# Patient Record
Sex: Female | Born: 1985 | Race: White | Hispanic: No | Marital: Single | Smoking: Current every day smoker
Health system: Southern US, Community
[De-identification: ages and names within clinical notes are randomized; demographics above are authoritative.]

## PROBLEM LIST (undated history)

## (undated) ENCOUNTER — Inpatient Hospital Stay (HOSPITAL_COMMUNITY): Payer: Self-pay

## (undated) DIAGNOSIS — Z87442 Personal history of urinary calculi: Secondary | ICD-10-CM

## (undated) DIAGNOSIS — O021 Missed abortion: Secondary | ICD-10-CM

## (undated) DIAGNOSIS — J45909 Unspecified asthma, uncomplicated: Secondary | ICD-10-CM

## (undated) DIAGNOSIS — R519 Headache, unspecified: Secondary | ICD-10-CM

## (undated) DIAGNOSIS — R12 Heartburn: Secondary | ICD-10-CM

## (undated) DIAGNOSIS — O26899 Other specified pregnancy related conditions, unspecified trimester: Secondary | ICD-10-CM

## (undated) DIAGNOSIS — R51 Headache: Secondary | ICD-10-CM

## (undated) HISTORY — PX: LEEP: SHX91

## (undated) HISTORY — PX: WISDOM TOOTH EXTRACTION: SHX21

## (undated) HISTORY — DX: Unspecified asthma, uncomplicated: J45.909

---

## 2007-09-11 ENCOUNTER — Other Ambulatory Visit: Admission: RE | Admit: 2007-09-11 | Discharge: 2007-09-11 | Payer: Self-pay | Admitting: Obstetrics and Gynecology

## 2008-01-03 ENCOUNTER — Ambulatory Visit (HOSPITAL_COMMUNITY): Admission: RE | Admit: 2008-01-03 | Discharge: 2008-01-03 | Payer: Self-pay | Admitting: Obstetrics & Gynecology

## 2008-03-24 ENCOUNTER — Inpatient Hospital Stay (HOSPITAL_COMMUNITY): Admission: AD | Admit: 2008-03-24 | Discharge: 2008-03-25 | Payer: Self-pay | Admitting: Obstetrics & Gynecology

## 2008-03-25 ENCOUNTER — Ambulatory Visit: Payer: Self-pay | Admitting: Obstetrics and Gynecology

## 2008-03-25 ENCOUNTER — Inpatient Hospital Stay (HOSPITAL_COMMUNITY): Admission: AD | Admit: 2008-03-25 | Discharge: 2008-03-29 | Payer: Self-pay | Admitting: Obstetrics & Gynecology

## 2010-03-28 IMAGING — US US SOFT TISSUE HEAD/NECK
1 series · 14 of 25 positions shown · non-contrast
Comparison: None

CLINICAL DATA: Enlarged thyroid gland, tachycardia, weight gain

THYROID ULTRASOUND
TECHNIQUE: Ultrasound examination of the thyroid gland and
adjacent soft tissues was performed.

[Series 1: us soft tissue head/neck · 0.07mm/px · 14 of 26 slices shown]
[im 1/26]
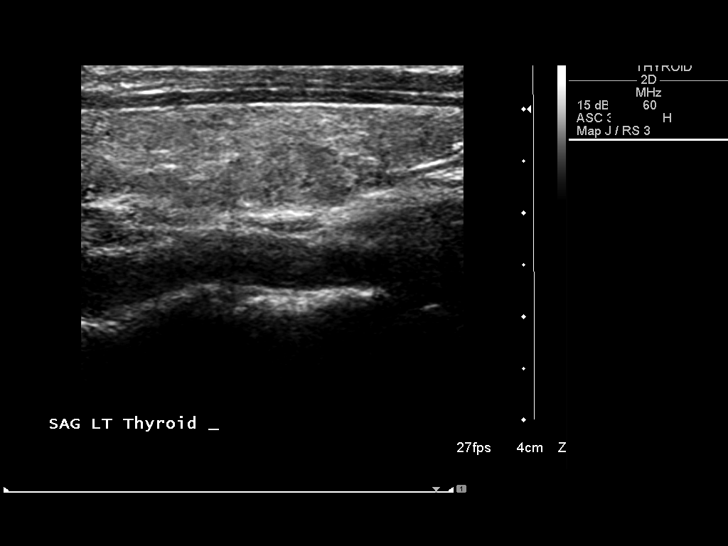
[im 3/26]
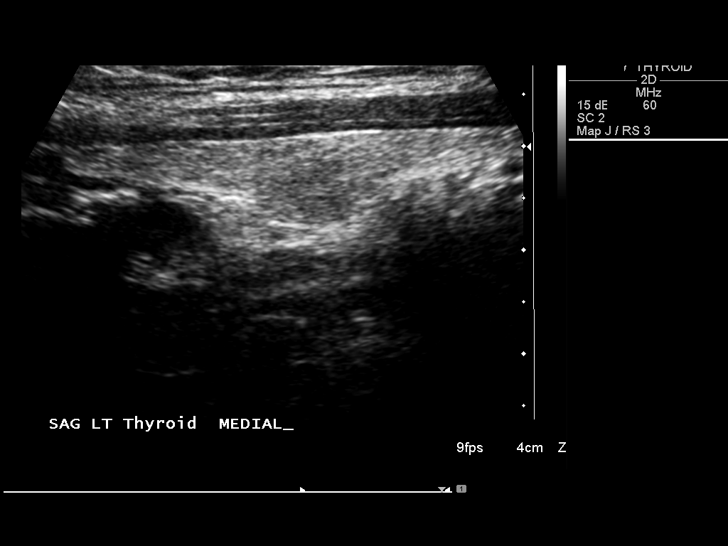
[im 5/26]
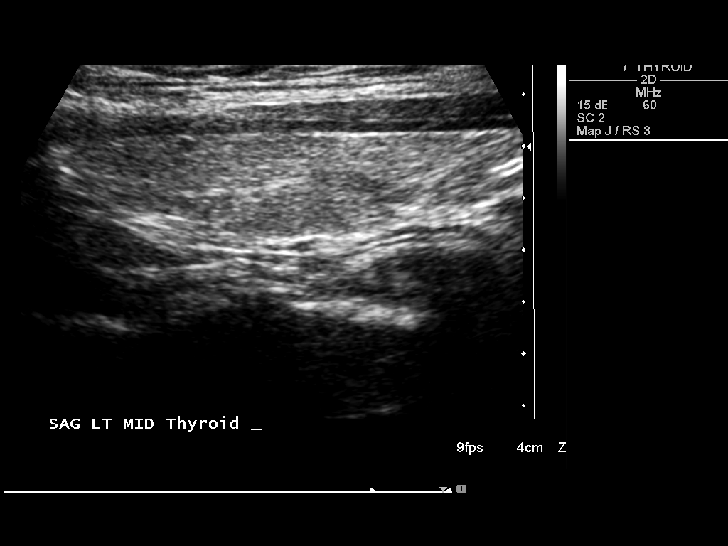
[im 7/26]
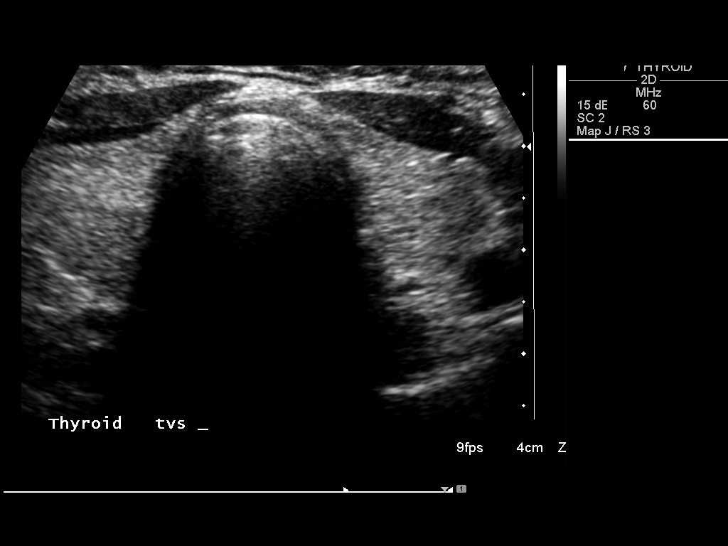
[im 9/26]
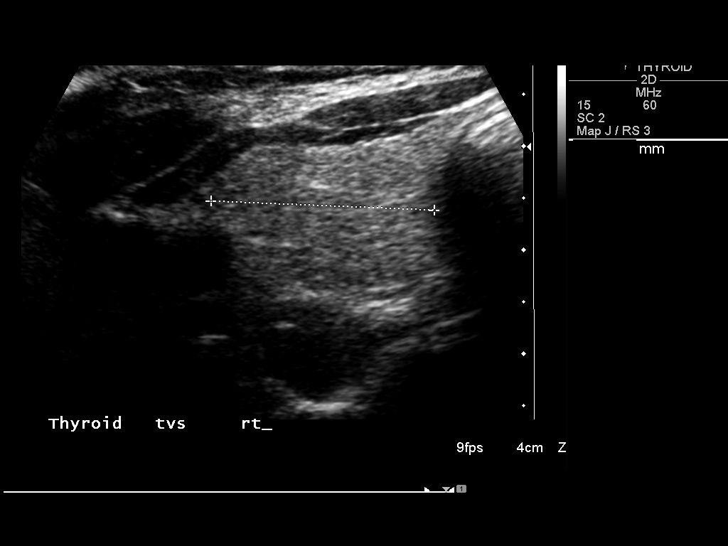
[im 10/26]
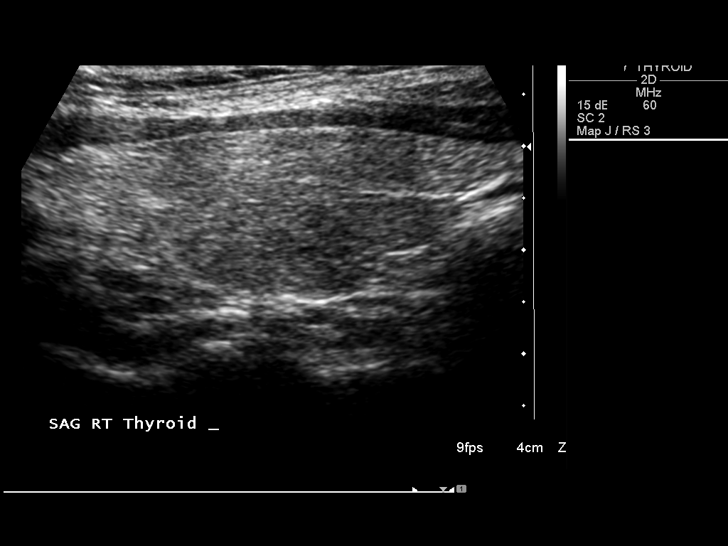
[im 12/26]
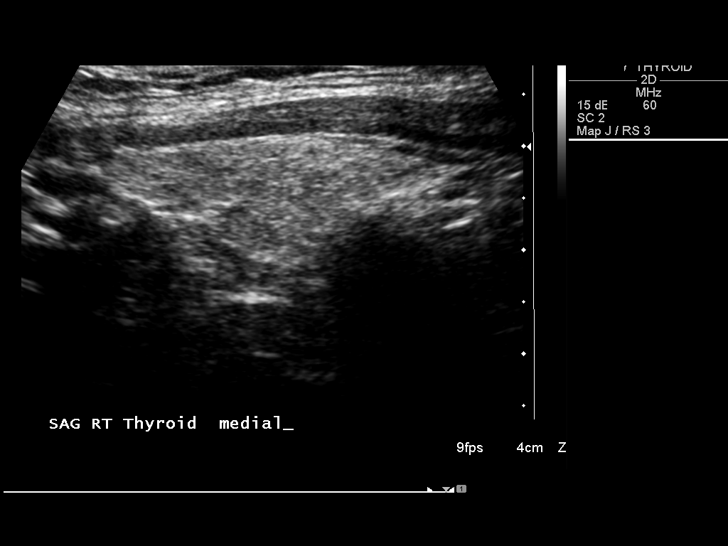
[im 14/26]
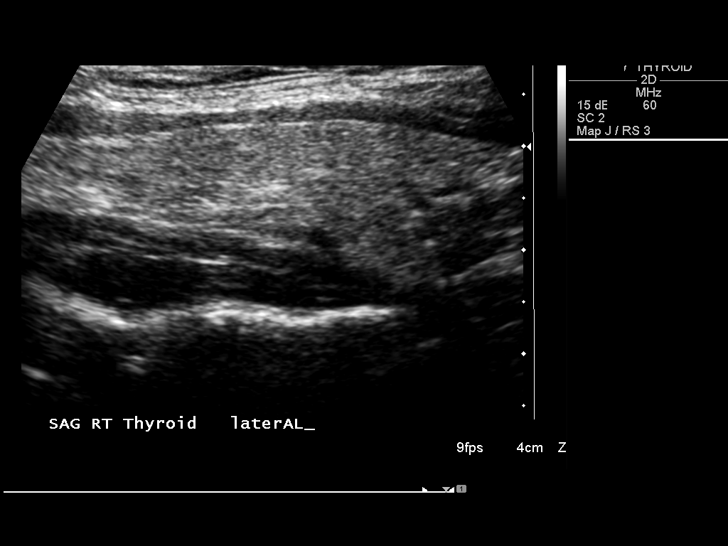
[im 16/26]
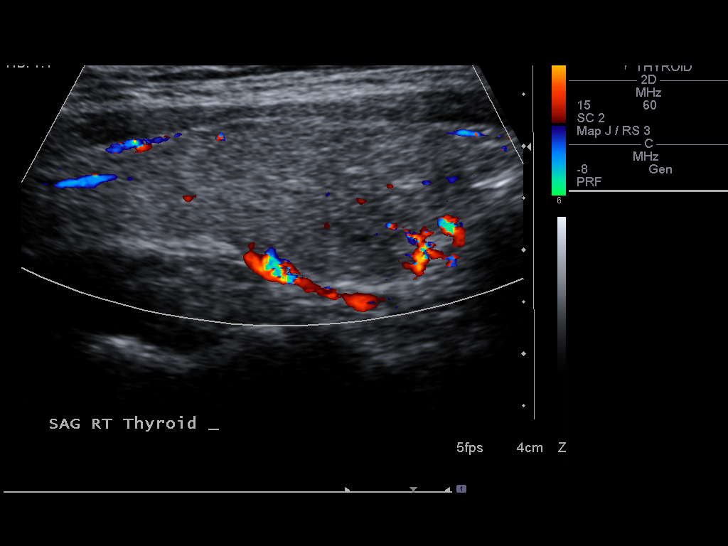
[im 17/26]
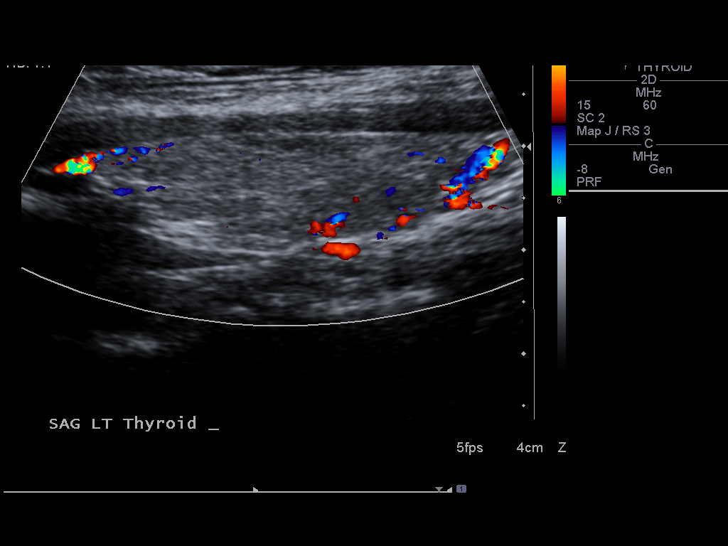
[im 19/26]
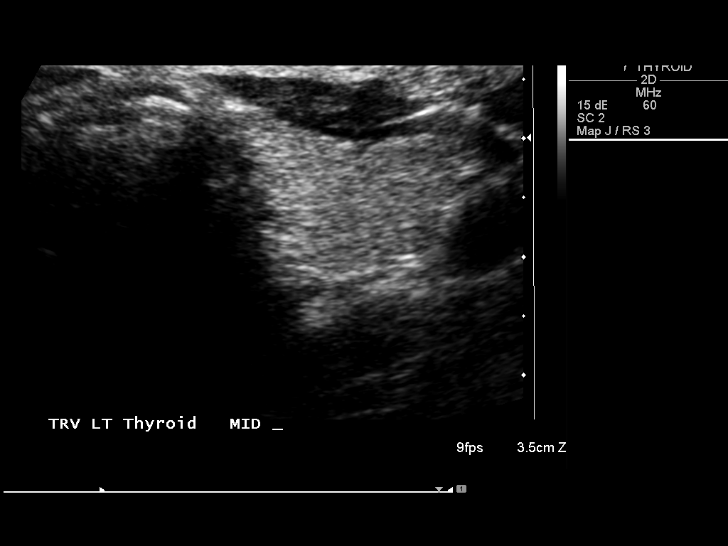
[im 21/26]
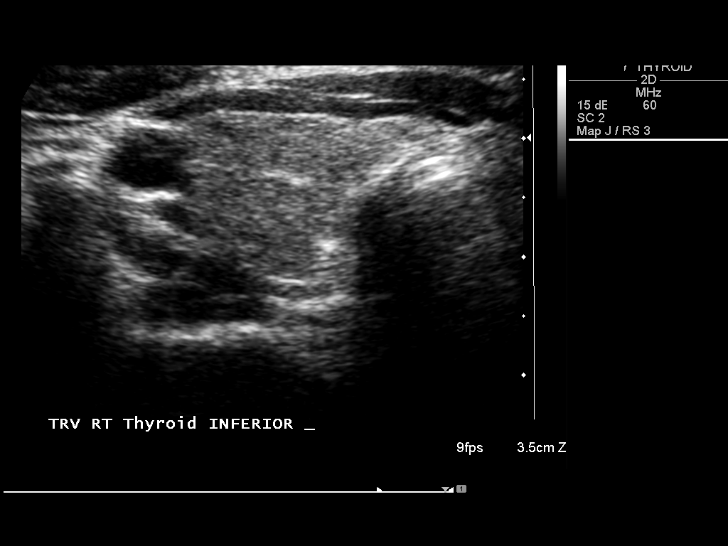
[im 23/26]
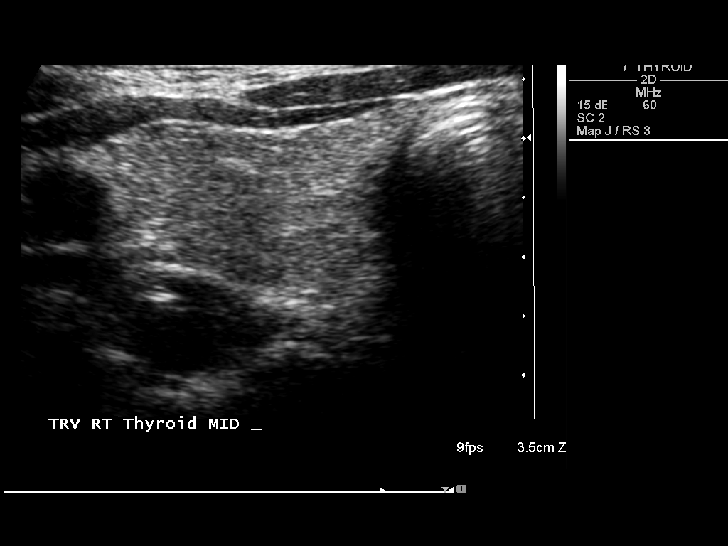
[im 26/26]
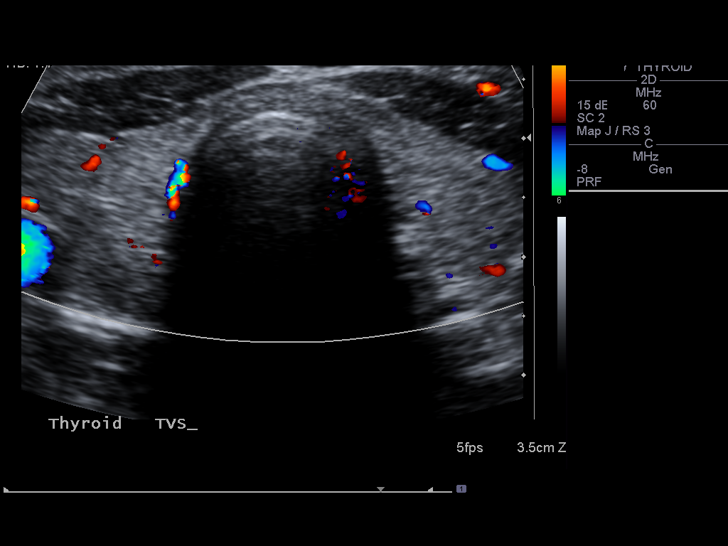

[14 of 25 positions shown; findings below may reference images not displayed]

FINDINGS: Right thyroid lobe 4.7 cm length by 1.7 cm AP by 2.2 cm transverse.
Left thyroid lobe 4.4 cm length by 1.1 cm AP by 1.8 cm transverse.
Thyroid isthmus 2 mm thick.
Mild diffuse inhomogeneity of thyroid echogenicity.
No focal thyroid mass, nodule, calcification, or cyst.
No regional adenopathy identified.
IMPRESSION: No significant thyroid abnormalities.

## 2010-06-07 DIAGNOSIS — O021 Missed abortion: Secondary | ICD-10-CM

## 2010-06-07 HISTORY — DX: Missed abortion: O02.1

## 2010-10-20 NOTE — Op Note (Signed)
Christy Ross, Christy Ross              ACCOUNT NO.:  [id number]   MEDICAL RECORD NO.:  [id number]          PATIENT TYPE:  INP   LOCATION:  9143                          FACILITY:  [facility]   PHYSICIAN:  Tilda Burrow, M.D. DATE OF BIRTH:  05-Jan-1986   DATE OF PROCEDURE:  03/26/2008  DATE OF DISCHARGE:                               OPERATIVE REPORT   PREOPERATIVE DIAGNOSES:  1. Intrauterine pregnancy at 7 and 6 weeks' gestation.  2. Prolonged second stage.  3. Failure to descend.   POSTOPERATIVE DIAGNOSES:  1. Intrauterine pregnancy at 44 and 6 weeks' gestation.  2. Prolonged second stage.  3. Failure to descend.   PROCEDURE:  Primary low transverse cesarean section via Pfannenstiel  incision.   SURGEON:  Tilda Burrow, MD   ASSISTANT:  Norton Blizzard, MD   ANESTHESIA:  Epidural.   IV FLUIDS:  1600 mL of lactated Ringer's.   URINE OUTPUT:  400 mL.   ESTIMATED BLOOD LOSS:  500 mL.   INDICATIONS:  The patient is a 25 year old gravida 1, para 0 who  presented at 21 and 6/7th weeks in early labor.  The patient progressed  to full dilatation and proceeded to push.  The fetus was noted to be at  +2 station at the beginning of pushing, but did not move from this  station for over 3 hours of pushing.  Given her prolonged second stage,  the patient was counseled regarding cesarean section.  The risk of  surgery including bleeding which might require transfusion, infection  which might require antibiotics, injury to surrounding organs of baby,  need for additional procedures including hysterectomy in the event of a  life-threatening bleed were discussed with the patient, and written  informed consent was obtained.   FINDINGS:  Viable female infant in direct occiput posterior cephalic  presentation.  Apgars were 9 and 10.  Weight 6 pounds 13 ounces.  There  was spontaneous placental delivery, intact with three-vessel cord.  Normal uterus and bilateral adnexae.   SPECIMENS:   Placenta, which was sent to Pathology.   COMPLICATIONS:  None.   PROCEDURE DETAILS:  The patient was taken to the operating room where  epidural anesthesia was dosed up to a surgical level and found to be  adequate.  She was then placed in a dorsal supine position with a  leftward tilt, and prepped and draped in a sterile manner.  A  Pfannenstiel skin incision was made with a scalpel and carried through  to the underlying layer of fascia.  The fascia was incised in the  midline and this incision was extended bilaterally using Mayo scissors.  Kochers were applied to the superior aspect of the fascial incision and  the underlying rectus muscles were dissected off bluntly and sharply.  A  similar process was carried out on the inferior aspect.  The rectus  muscles were separated in the midline bluntly and the peritoneum was  entered bluntly.  This peritoneal incision was extended superiorly and  inferiorly with good visualization of bladder and bowel.  Attention was  then turned to the uterus and  the lower uterine segment where a bladder  flap was created using Metzenbaum scissors.  A transverse hysterotomy  was then made using a scalpel and extended bilaterally in a blunt  fashion.  The infant's head was encountered.  The infant was noted to  have loose nuchal cord x2, which was easily reduced and infant was  delivered atraumatically.  Cord was clamped and cut, the infant was  suctioned, and handed over to the awaiting neonatologists.  Uterine  massage was administered, which helped in delivery of the placenta,  which was intact with three-vessel cord.  The uterus was then cleared of  all clot and debris.  The hysterotomy was repaired using 0 Monocryl in a  running locked fashion.  The bladder flap was repaired using 2-0 plain  gut in a running fashion.  At this point, the pelvis and gutters were  cleared of all clot and debris and hemostasis was reconfirmed on all  surfaces.  The  peritoneum was then reapproximated in a running fashion  using 2-0 plain gut, and the rectus muscles were reapproximated in 2  interrupted stitches of 2-0 plain gut.  The fascia was then  reapproximated using 0 Vicryl in a running stitch.  The subcutaneous  layer was irrigated and dried and hemostasis confirmed.  It was then  reapproximated using 2-0 plain gut interrupted stitches and the skin was  closed with staples.  At the end of the closure, 30 mL of 0.5% Marcaine  was injected around the incision to provide extra analgesia.  The  patient tolerated the procedure well.  Sponge, instrument, and needle  counts were correct x2.  She was taken to the recovery room awake and in  stable condition.      Norton Blizzard, MD      Tilda Burrow, M.D.  Electronically Signed    UAD/MEDQ  D:  03/27/2008  T:  03/27/2008  Job:  [id number]

## 2010-10-20 NOTE — Discharge Summary (Signed)
NAMEEVONNE, RINKS              ACCOUNT NO.:  [id number]   MEDICAL RECORD NO.:  [id number]          PATIENT TYPE:  INP   LOCATION:  9143                          FACILITY:  [facility]   PHYSICIAN:  Lesly Dukes, M.D. DATE OF BIRTH:  Jan 02, 1986   DATE OF ADMISSION:  03/25/2008  DATE OF DISCHARGE:  03/29/2008                               DISCHARGE SUMMARY   REASON FOR HOSPITALIZATION:  A 25 year old G1, P0 presents at 37-6/7th  weeks with premature rupture of membranes.  The patient has a history of  idiopathic polyhydramnios.  The patient admitted to labor and delivery,  had prolonged rupture of membranes.  After 3 hours in labor, the patient  taken for primary C-section for prolonged second stage and failure to  descend.  A viable female infant in the OP position was delivered with  Apgars 9 and 10.  Infant and mother stable, and mother was taken to  recovery room.  Please see dictated operative note for full details.  The patient followed a routine postoperative course with no evidence of  postpartum hemorrhage.  The patient stable and recovering well by  discharge.  Patient is breastfeeding and will discuss birth control with  her ob-gyn at follow-up appt.   DISCHARGE MEDICATIONS:  Percocet, ibuprofen, Colace, and prenatal  vitamins.   LABS:  Hemoglobin 9.8 and hematocrit 28.3.   DISCHARGE INSTRUCTIONS:  The patient to follow up on Monday with [facility] to remove staples and will have 6-week postpartum check with [facility].      Delbert Harness, MD  Electronically Signed     ______________________________  Lesly Dukes, M.D.    KB/MEDQ  D:  03/29/2008  T:  03/29/2008  Job:  [id number]

## 2011-03-09 LAB — CBC
HCT: 28.4 — ABNORMAL LOW
Hemoglobin: 12.5
MCHC: 34
MCV: 88.7
Platelets: 174
RDW: 13.6
RDW: 13.7

## 2011-03-09 LAB — GLUCOSE, CAPILLARY: Glucose-Capillary: 103 — ABNORMAL HIGH

## 2013-11-07 ENCOUNTER — Other Ambulatory Visit: Payer: Self-pay | Admitting: Obstetrics & Gynecology

## 2013-11-07 DIAGNOSIS — O3680X0 Pregnancy with inconclusive fetal viability, not applicable or unspecified: Secondary | ICD-10-CM

## 2013-11-13 ENCOUNTER — Ambulatory Visit (INDEPENDENT_AMBULATORY_CARE_PROVIDER_SITE_OTHER): Payer: Medicaid Other

## 2013-11-13 ENCOUNTER — Ambulatory Visit (INDEPENDENT_AMBULATORY_CARE_PROVIDER_SITE_OTHER): Payer: Medicaid Other | Admitting: Obstetrics & Gynecology

## 2013-11-13 ENCOUNTER — Encounter: Payer: Self-pay | Admitting: Obstetrics & Gynecology

## 2013-11-13 VITALS — BP 100/60 | Ht 66.0 in | Wt 157.0 lb

## 2013-11-13 DIAGNOSIS — H6690 Otitis media, unspecified, unspecified ear: Secondary | ICD-10-CM

## 2013-11-13 DIAGNOSIS — O3680X0 Pregnancy with inconclusive fetal viability, not applicable or unspecified: Secondary | ICD-10-CM

## 2013-11-13 MED ORDER — AMOXICILLIN-POT CLAVULANATE 875-125 MG PO TABS: 1.0000 | ORAL_TABLET | Freq: Two times a day (BID) | ORAL | Status: DC

## 2013-11-13 NOTE — Progress Notes (Signed)
U/S-single IUP with +FCA noted FHR-121 bpm, cx appears closed, bilateral adnexa appears WNL, CRL c/w 6+1wks EDD 07/08/2014

## 2013-11-13 NOTE — Progress Notes (Signed)
Patient ID: Christy Ross, female   DOB: [birth year], 29 y.o.   MRN: [id number] Pt with left ear pain for past 2 days No URI symptoms No history as a kid of ear infections Has not been swimming at all Had another ear infection 4-5 months ago for first time in life Blood pressure 100/60, height 5\' 6"  (1.676 m), weight 157 lb (71.215 kg).   No burning with urination, frequency or urgency No nausea, vomiting or diarrhea Nor fever chills or other constitutional symptoms  Exam Left ear TM is injected no cerumen no ear canal erythema or a normalities No lymphadenopathy Mouth clear no tooth abscess of oral problems noted  Otitis media  Augmentin 875 mg BID x 10days

## 2013-11-20 ENCOUNTER — Ambulatory Visit (INDEPENDENT_AMBULATORY_CARE_PROVIDER_SITE_OTHER): Payer: Medicaid Other | Admitting: Advanced Practice Midwife

## 2013-11-20 ENCOUNTER — Encounter: Payer: Self-pay | Admitting: Advanced Practice Midwife

## 2013-11-20 VITALS — BP 90/50 | Wt 156.0 lb

## 2013-11-20 DIAGNOSIS — Z3480 Encounter for supervision of other normal pregnancy, unspecified trimester: Secondary | ICD-10-CM | POA: Insufficient documentation

## 2013-11-20 DIAGNOSIS — J45909 Unspecified asthma, uncomplicated: Secondary | ICD-10-CM | POA: Insufficient documentation

## 2013-11-20 DIAGNOSIS — F1290 Cannabis use, unspecified, uncomplicated: Secondary | ICD-10-CM

## 2013-11-20 DIAGNOSIS — Z331 Pregnant state, incidental: Secondary | ICD-10-CM

## 2013-11-20 DIAGNOSIS — O99330 Smoking (tobacco) complicating pregnancy, unspecified trimester: Secondary | ICD-10-CM

## 2013-11-20 DIAGNOSIS — Z1389 Encounter for screening for other disorder: Secondary | ICD-10-CM

## 2013-11-20 DIAGNOSIS — F17200 Nicotine dependence, unspecified, uncomplicated: Secondary | ICD-10-CM

## 2013-11-20 DIAGNOSIS — O34219 Maternal care for unspecified type scar from previous cesarean delivery: Secondary | ICD-10-CM | POA: Insufficient documentation

## 2013-11-20 MED ORDER — DOXYLAMINE-PYRIDOXINE 10-10 MG PO TBEC: DELAYED_RELEASE_TABLET | ORAL | Status: DC

## 2013-11-20 NOTE — Progress Notes (Signed)
  Subjective:    Christy Ross is a [id number] 10735w1d being seen today for her first obstetrical visit.  Her obstetrical history is significant for smoker.  Pregnancy history fully reviewed.  Patient reports nausea.  Filed Vitals:   11/20/13 1352  BP: 90/50  Weight: 156 lb (70.761 kg)    HISTORY: OB History  Gravida Para Term Preterm AB SAB TAB Ectopic Multiple Living  3 1 1  0 1 1 0 0 0 1    # Outcome Date GA Lbr Len/2nd Weight Sex Delivery Anes PTL Lv  3 CUR           2 SAB 06/2010          1 TRM 03/26/08 2760w0d  6 lb 13 oz (3.09 kg) M LTCS Spinal N Y     Past Medical History  Diagnosis Date  . Asthma    Past Surgical History  Procedure Laterality Date  . Cesarean section  03/26/2008   Family History  Problem Relation Age of Onset  . Multiple sclerosis Mother   . Hypertension Father   . Hyperlipidemia Father   . Diabetes Father   . Cancer Paternal Grandmother     breast  . Cancer Paternal Grandfather      Exam                                           Skin: normal coloration and turgor, no rashes    Neurologic: oriented, normal, normal mood   Extremities: normal strength, tone, and muscle mass   HEENT PERRLA   Mouth/Teeth mucous membranes moist, pharynx normal without lesions   Neck supple and no masses   Cardiovascular: regular rate and rhythm   Respiratory:  appears well, vitals normal, no respiratory distress, acyanotic, normal RR   Abdomen: soft, non-tender; bowel sounds normal; no masses,  no organomegaly          Assessment:    Pregnancy: [id number] Patient Active Problem List   Diagnosis Date Noted  . Supervision of other normal pregnancy 11/20/2013  . History of cesarean delivery, currently pregnant 11/20/2013  . Asthma         Plan:     Initial labs drawn. Continue prenatal vitamins  Problem list reviewed and updated  Pt counseled about smoking.  Down to 2/day, plans to quit. Reviewed n/v relief measures and warning s/s to  report.  Diglegis rx and prior auth sent Reviewed recommended weight gain based on pre-gravid BMI  Encouraged well-balanced diet Genetic Screening discussed Integrated Screen: requested.  Ultrasound discussed; fetal survey: requested.  Follow up in 5 weeks for NT/IT  Ross,Christy Wurster 11/20/2013

## 2013-11-20 NOTE — Addendum Note (Signed)
Addended by: Criss AlvinePULLIAM, CHRYSTAL G on: 11/20/2013 04:35 PM   Modules accepted: Orders

## 2013-11-21 DIAGNOSIS — F1290 Cannabis use, unspecified, uncomplicated: Secondary | ICD-10-CM | POA: Insufficient documentation

## 2013-11-21 LAB — URINALYSIS, ROUTINE W REFLEX MICROSCOPIC
Bilirubin Urine: NEGATIVE
Glucose, UA: NEGATIVE mg/dL
HGB URINE DIPSTICK: NEGATIVE
KETONES UR: NEGATIVE mg/dL
Leukocytes, UA: NEGATIVE
NITRITE: NEGATIVE
PH: 6.5 (ref 5.0–8.0)
PROTEIN: NEGATIVE mg/dL
Specific Gravity, Urine: 1.013 (ref 1.005–1.030)
Urobilinogen, UA: 0.2 mg/dL (ref 0.0–1.0)

## 2013-11-21 LAB — ANTIBODY SCREEN: Antibody Screen: NEGATIVE

## 2013-11-21 LAB — HEPATITIS B SURFACE ANTIGEN: HEP B S AG: NEGATIVE

## 2013-11-21 LAB — VARICELLA ZOSTER ANTIBODY, IGG: VARICELLA IGG: 541.3 {index} — AB (ref <135.00)

## 2013-11-21 LAB — DRUG SCREEN, URINE, NO CONFIRMATION
Amphetamine Screen, Ur: NEGATIVE
BARBITURATE QUANT UR: NEGATIVE
Benzodiazepines.: NEGATIVE
COCAINE METABOLITES: NEGATIVE
CREATININE, U: 63.3 mg/dL
METHADONE: NEGATIVE
Marijuana Metabolite: POSITIVE — AB
OPIATE SCREEN, URINE: NEGATIVE
PROPOXYPHENE: NEGATIVE
Phencyclidine (PCP): NEGATIVE

## 2013-11-21 LAB — OXYCODONE SCREEN, UA, RFLX CONFIRM: OXYCODONE SCRN UR: NEGATIVE ng/mL

## 2013-11-21 LAB — CBC
HEMATOCRIT: 40.8 % (ref 36.0–46.0)
HEMOGLOBIN: 14.1 g/dL (ref 12.0–15.0)
MCH: 31.4 pg (ref 26.0–34.0)
MCHC: 34.6 g/dL (ref 30.0–36.0)
MCV: 90.9 fL (ref 78.0–100.0)
Platelets: 275 10*3/uL (ref 150–400)
RBC: 4.49 MIL/uL (ref 3.87–5.11)
RDW: 12.5 % (ref 11.5–15.5)
WBC: 8.8 10*3/uL (ref 4.0–10.5)

## 2013-11-21 LAB — ABO AND RH: RH TYPE: POSITIVE

## 2013-11-21 LAB — RUBELLA SCREEN: RUBELLA: 1.36 {index} — AB (ref <0.90)

## 2013-11-21 LAB — GC/CHLAMYDIA PROBE AMP
CT Probe RNA: NEGATIVE
GC PROBE AMP APTIMA: NEGATIVE

## 2013-11-21 LAB — HIV ANTIBODY (ROUTINE TESTING W REFLEX): HIV: NONREACTIVE

## 2013-11-21 LAB — RPR

## 2013-11-22 LAB — URINE CULTURE
COLONY COUNT: NO GROWTH
ORGANISM ID, BACTERIA: NO GROWTH

## 2013-11-27 LAB — CYSTIC FIBROSIS DIAGNOSTIC STUDY

## 2013-12-05 ENCOUNTER — Inpatient Hospital Stay (HOSPITAL_COMMUNITY)
Admission: AD | Admit: 2013-12-05 | Discharge: 2013-12-05 | Disposition: A | Discharge status: Home / Self Care | Payer: Medicaid Other | Source: Ambulatory Visit | Attending: Family Medicine | Admitting: Family Medicine

## 2013-12-05 ENCOUNTER — Telehealth: Payer: Self-pay | Admitting: Obstetrics and Gynecology

## 2013-12-05 ENCOUNTER — Encounter (HOSPITAL_COMMUNITY): Payer: Self-pay | Admitting: *Deleted

## 2013-12-05 ENCOUNTER — Telehealth: Payer: Self-pay | Admitting: *Deleted

## 2013-12-05 DIAGNOSIS — O99340 Other mental disorders complicating pregnancy, unspecified trimester: Secondary | ICD-10-CM | POA: Insufficient documentation

## 2013-12-05 DIAGNOSIS — O99330 Smoking (tobacco) complicating pregnancy, unspecified trimester: Secondary | ICD-10-CM | POA: Insufficient documentation

## 2013-12-05 DIAGNOSIS — O219 Vomiting of pregnancy, unspecified: Secondary | ICD-10-CM

## 2013-12-05 DIAGNOSIS — F1210 Cannabis abuse, uncomplicated: Secondary | ICD-10-CM

## 2013-12-05 DIAGNOSIS — O210 Mild hyperemesis gravidarum: Secondary | ICD-10-CM | POA: Insufficient documentation

## 2013-12-05 DIAGNOSIS — O34219 Maternal care for unspecified type scar from previous cesarean delivery: Secondary | ICD-10-CM

## 2013-12-05 DIAGNOSIS — F1290 Cannabis use, unspecified, uncomplicated: Secondary | ICD-10-CM

## 2013-12-05 LAB — URINALYSIS, ROUTINE W REFLEX MICROSCOPIC
BILIRUBIN URINE: NEGATIVE
Glucose, UA: NEGATIVE mg/dL
HGB URINE DIPSTICK: NEGATIVE
Ketones, ur: NEGATIVE mg/dL
Nitrite: NEGATIVE
PROTEIN: NEGATIVE mg/dL
Specific Gravity, Urine: 1.02 (ref 1.005–1.030)
UROBILINOGEN UA: 0.2 mg/dL (ref 0.0–1.0)
pH: 6.5 (ref 5.0–8.0)

## 2013-12-05 LAB — URINE MICROSCOPIC-ADD ON

## 2013-12-05 MED ORDER — PROMETHAZINE HCL 25 MG/ML IJ SOLN
25.0000 mg | Freq: Once | INTRAMUSCULAR | Status: AC
  Administered 2013-12-05: 25 mg via INTRAMUSCULAR
  Filled 2013-12-05: qty 1

## 2013-12-05 NOTE — MAU Note (Signed)
Patient states she has had vomiting for the entire pregnancy. Has gotten worse and not able to keep anything down. Did eat on the way to the hospital and has kept it down. Has aching from vomiting. Denies bleeding.

## 2013-12-05 NOTE — MAU Provider Note (Signed)
History     CSN: [id number]  Arrival date and time: 12/05/13 1751   First Provider Initiated Contact with Patient 12/05/13 1815      Chief Complaint  Patient presents with  . Emesis During Pregnancy   HPI Comments: Christy Ross 28 y.o. G3P1011 3554w2d presents to MAU with nausea and vomiting. She ate Tacos on her way here. She is taking diclegis with some relief. She has positive marijuana drug screen on 11/20/13     Past Medical History  Diagnosis Date  . Asthma     Past Surgical History  Procedure Laterality Date  . Cesarean section  03/26/2008    Family History  Problem Relation Age of Onset  . Multiple sclerosis Mother   . Hypertension Father   . Hyperlipidemia Father   . Diabetes Father   . Cancer Paternal Grandmother     breast  . Cancer Paternal Grandfather     History  Substance Use Topics  . Smoking status: Current Every Day Smoker -- 3.00 packs/day    Types: Cigarettes  . Smokeless tobacco: Never Used  . Alcohol Use: No    Allergies: No Known Allergies  Prescriptions prior to admission  Medication Sig Dispense Refill  . acetaminophen (TYLENOL) 500 MG tablet Take 1,000 mg by mouth every 6 (six) hours as needed for headache.      Marland Kitchen. amoxicillin-clavulanate (AUGMENTIN) 875-125 MG per tablet Take 1 tablet by mouth 2 (two) times daily.  20 tablet  0  . Doxylamine-Pyridoxine 10-10 MG TBEC 2 PO qhs; may take 1po in am and 1po in afternoon prn nausea  120 tablet  3  . Prenatal Vit-Fe Fumarate-FA (MULTIVITAMIN-PRENATAL) 27-0.8 MG TABS tablet Take 1 tablet by mouth daily at 12 noon.        Review of Systems  Constitutional: Negative.   HENT: Negative.   Eyes: Negative.   Respiratory: Negative.   Cardiovascular: Negative.   Gastrointestinal: Positive for nausea and vomiting.  Genitourinary: Negative.   Musculoskeletal: Negative.   Skin: Negative.   Neurological: Negative.   Psychiatric/Behavioral: Negative.    Physical Exam   Blood pressure 96/53,  pulse 86, temperature 98.7 F (37.1 C), temperature source Oral, resp. rate 16, height 5' 3.5" (1.613 m), weight 71.94 kg (158 lb 9.6 oz), SpO2 98.00%.  Physical Exam  Constitutional: She is oriented to person, place, and time. She appears well-developed and well-nourished.  HENT:  Head: Normocephalic and atraumatic.  Eyes: Pupils are equal, round, and reactive to light.  Cardiovascular: Normal rate, regular rhythm and normal heart sounds.   Respiratory: Effort normal and breath sounds normal.  GI: Soft. Bowel sounds are normal. She exhibits no distension. There is no tenderness. There is no rebound.  Genitourinary:  Not done  Musculoskeletal: Normal range of motion.  Neurological: She is alert and oriented to person, place, and time.  Skin: Skin is warm and dry.  Psychiatric: She has a normal mood and affect. Her behavior is normal. Judgment and thought content normal.   Results for orders placed during the hospital encounter of 12/05/13 (from the past 24 hour(s))  URINALYSIS, ROUTINE W REFLEX MICROSCOPIC     Status: Abnormal   Collection Time    12/05/13  5:53 PM      Result Value Ref Range   Color, Urine YELLOW  YELLOW   APPearance CLEAR  CLEAR   Specific Gravity, Urine 1.020  1.005 - 1.030   pH 6.5  5.0 - 8.0   Glucose, UA NEGATIVE  NEGATIVE mg/dL   Hgb urine dipstick NEGATIVE  NEGATIVE   Bilirubin Urine NEGATIVE  NEGATIVE   Ketones, ur NEGATIVE  NEGATIVE mg/dL   Protein, ur NEGATIVE  NEGATIVE mg/dL   Urobilinogen, UA 0.2  0.0 - 1.0 mg/dL   Nitrite NEGATIVE  NEGATIVE   Leukocytes, UA TRACE (*) NEGATIVE  URINE MICROSCOPIC-ADD ON     Status: Abnormal   Collection Time    12/05/13  5:53 PM      Result Value Ref Range   Squamous Epithelial / LPF FEW (*) RARE   WBC, UA 3-6  <3 WBC/hpf   RBC / HPF 0-2  <3 RBC/hpf   Bacteria, UA FEW (*) RARE   Urine-Other MUCOUS PRESENT       MAU Course  Procedures  MDM Phenergan 25 mg IM now  Assessment and Plan   A: Nausea/  vomiting pregnancy  P: Phenergan 25 mg po q6 hrs Small frequent meals/ bland diet Follow up with OB    Aldean Pipe, Rubbie BattiestLinda Miller 12/05/2013, 7:25 PM

## 2013-12-05 NOTE — Telephone Encounter (Signed)
Pt calling back stating "feeling worse, concerned because she is so weak from n/v and unable to keep anything down." Pt has been taking Diclegis. Pt informed to go to MAU to be evaluated for dehydration, possible IV fluids.

## 2013-12-05 NOTE — Telephone Encounter (Signed)
Pt states continues to have nausea and vomiting, taking Diclegis for n/v.  Pt encouraged to take Diclegis 1 tablet in am, 1 at lunch, and two at night if no improvement call office back, push fluids as much as possible. Pt verbalized understanding.

## 2013-12-05 NOTE — Discharge Instructions (Signed)

## 2013-12-06 MED ORDER — PROMETHAZINE HCL 25 MG PO TABS: 12.5000 mg | ORAL_TABLET | Freq: Four times a day (QID) | ORAL | Status: DC | PRN

## 2013-12-06 NOTE — MAU Provider Note (Signed)
Attestation of Attending Supervision of Advanced Practitioner (PA/CNM/NP): Evaluation and management procedures were performed by the Advanced Practitioner under my supervision and collaboration.  I have reviewed the Advanced Practitioner's note and chart, and I agree with the management and plan.  Reva BoresPRATT,TANYA S, MD [facility] Attending 12/06/2013 12:58 AM

## 2013-12-07 LAB — CULTURE, OB URINE
CULTURE: NO GROWTH
Colony Count: NO GROWTH
Special Requests: NORMAL

## 2013-12-18 ENCOUNTER — Other Ambulatory Visit: Payer: Self-pay | Admitting: Advanced Practice Midwife

## 2013-12-18 ENCOUNTER — Ambulatory Visit (INDEPENDENT_AMBULATORY_CARE_PROVIDER_SITE_OTHER): Payer: Medicaid Other

## 2013-12-18 ENCOUNTER — Ambulatory Visit (INDEPENDENT_AMBULATORY_CARE_PROVIDER_SITE_OTHER): Payer: Medicaid Other | Admitting: Advanced Practice Midwife

## 2013-12-18 ENCOUNTER — Encounter: Payer: Self-pay | Admitting: Advanced Practice Midwife

## 2013-12-18 VITALS — BP 104/66 | Wt 163.0 lb

## 2013-12-18 DIAGNOSIS — F1920 Other psychoactive substance dependence, uncomplicated: Secondary | ICD-10-CM

## 2013-12-18 DIAGNOSIS — Z3480 Encounter for supervision of other normal pregnancy, unspecified trimester: Secondary | ICD-10-CM

## 2013-12-18 DIAGNOSIS — Z331 Pregnant state, incidental: Secondary | ICD-10-CM

## 2013-12-18 DIAGNOSIS — F1290 Cannabis use, unspecified, uncomplicated: Secondary | ICD-10-CM

## 2013-12-18 DIAGNOSIS — Z36 Encounter for antenatal screening of mother: Secondary | ICD-10-CM

## 2013-12-18 DIAGNOSIS — O99320 Drug use complicating pregnancy, unspecified trimester: Secondary | ICD-10-CM

## 2013-12-18 DIAGNOSIS — Z1389 Encounter for screening for other disorder: Secondary | ICD-10-CM

## 2013-12-18 DIAGNOSIS — Z3481 Encounter for supervision of other normal pregnancy, first trimester: Secondary | ICD-10-CM

## 2013-12-18 LAB — POCT URINALYSIS DIPSTICK
Blood, UA: NEGATIVE
Glucose, UA: NEGATIVE
Glucose, UA: NEGATIVE
KETONES UA: NEGATIVE
KETONES UA: NEGATIVE
LEUKOCYTES UA: NEGATIVE
Leukocytes, UA: NEGATIVE
Nitrite, UA: NEGATIVE
Nitrite, UA: NEGATIVE
Protein, UA: NEGATIVE
Protein, UA: NEGATIVE
RBC UA: NEGATIVE

## 2013-12-18 NOTE — Progress Notes (Signed)
U/S(11+1wks)-single IUP with +FCA noted, FHR-167 bpm, cx appears closed, bilateral adnexa appears WNL with C.L. Noted on LT, no free fluid noted within the pelvis, NB present, NT-1.137mm, CRL c/w dates, anterior Gr 0 placenta

## 2013-12-18 NOTE — Progress Notes (Signed)
Pt denies any problems or concerns at this time.  

## 2013-12-18 NOTE — Progress Notes (Signed)
[id number] 8023w1d Estimated Date of Delivery: 07/08/14  Blood pressure 104/66, weight 163 lb (73.936 kg).   BP weight and urine results all reviewed and noted.  Please refer to the obstetrical flow sheet for the fundal height and fetal heart rate documentation:     Had Nt/IT today, normal so far.                                       Patient reports good fetal movement, denies any bleeding and no rupture of membranes symptoms or regular contractions. Patient is without complaints.  Doing better with phenergan All questions were answered.  Plan:  Continued routine obstetrical care, counseld about smoking pot  Follow up in 3 weeks for OB appointment, 2nd IT

## 2013-12-26 LAB — MATERNAL SCREEN, INTEGRATED #1

## 2013-12-31 ENCOUNTER — Other Ambulatory Visit: Payer: Self-pay | Admitting: Advanced Practice Midwife

## 2014-01-02 ENCOUNTER — Other Ambulatory Visit: Payer: Self-pay | Admitting: *Deleted

## 2014-01-02 MED ORDER — PROMETHAZINE HCL 25 MG PO TABS: 12.5000 mg | ORAL_TABLET | Freq: Four times a day (QID) | ORAL | Status: DC | PRN

## 2014-01-15 ENCOUNTER — Encounter: Payer: Self-pay | Admitting: Women's Health

## 2014-01-15 ENCOUNTER — Ambulatory Visit (INDEPENDENT_AMBULATORY_CARE_PROVIDER_SITE_OTHER): Payer: Medicaid Other | Admitting: Women's Health

## 2014-01-15 VITALS — BP 114/60 | Wt 178.0 lb

## 2014-01-15 DIAGNOSIS — Z3482 Encounter for supervision of other normal pregnancy, second trimester: Secondary | ICD-10-CM

## 2014-01-15 DIAGNOSIS — Z36 Encounter for antenatal screening of mother: Secondary | ICD-10-CM

## 2014-01-15 DIAGNOSIS — Z331 Pregnant state, incidental: Secondary | ICD-10-CM

## 2014-01-15 DIAGNOSIS — Z1389 Encounter for screening for other disorder: Secondary | ICD-10-CM

## 2014-01-15 DIAGNOSIS — Z3480 Encounter for supervision of other normal pregnancy, unspecified trimester: Secondary | ICD-10-CM

## 2014-01-15 DIAGNOSIS — F1290 Cannabis use, unspecified, uncomplicated: Secondary | ICD-10-CM

## 2014-01-15 LAB — POCT URINALYSIS DIPSTICK
GLUCOSE UA: NEGATIVE
Ketones, UA: NEGATIVE
Leukocytes, UA: NEGATIVE
NITRITE UA: NEGATIVE
Protein, UA: NEGATIVE
RBC UA: NEGATIVE

## 2014-01-15 MED ORDER — PROMETHAZINE HCL 25 MG PO TABS: 12.5000 mg | ORAL_TABLET | Freq: Four times a day (QID) | ORAL | Status: DC | PRN

## 2014-01-15 NOTE — Patient Instructions (Signed)
Second Trimester of Pregnancy The second trimester is from week 13 through week 28, months 4 through 6. The second trimester is often a time when you feel your best. Your body has also adjusted to being pregnant, and you begin to feel better physically. Usually, morning sickness has lessened or quit completely, you may have more energy, and you may have an increase in appetite. The second trimester is also a time when the fetus is growing rapidly. At the end of the sixth month, the fetus is about 9 inches long and weighs about 1 pounds. You will likely begin to feel the baby move (quickening) between 18 and 20 weeks of the pregnancy. BODY CHANGES Your body goes through many changes during pregnancy. The changes vary from woman to woman.   Your weight will continue to increase. You will notice your lower abdomen bulging out.  You may begin to get stretch marks on your hips, abdomen, and breasts.  You may develop headaches that can be relieved by medicines approved by your health care provider.  You may urinate more often because the fetus is pressing on your bladder.  You may develop or continue to have heartburn as a result of your pregnancy.  You may develop constipation because certain hormones are causing the muscles that push waste through your intestines to slow down.  You may develop hemorrhoids or swollen, bulging veins (varicose veins).  You may have back pain because of the weight gain and pregnancy hormones relaxing your joints between the bones in your pelvis and as a result of a shift in weight and the muscles that support your balance.  Your breasts will continue to grow and be tender.  Your gums may bleed and may be sensitive to brushing and flossing.  Dark spots or blotches (chloasma, mask of pregnancy) may develop on your face. This will likely fade after the baby is born.  A dark line from your belly button to the pubic area (linea nigra) may appear. This will likely fade  after the baby is born.  You may have changes in your hair. These can include thickening of your hair, rapid growth, and changes in texture. Some women also have hair loss during or after pregnancy, or hair that feels dry or thin. Your hair will most likely return to normal after your baby is born. WHAT TO EXPECT AT YOUR PRENATAL VISITS During a routine prenatal visit:  You will be weighed to make sure you and the fetus are growing normally.  Your blood pressure will be taken.  Your abdomen will be measured to track your baby's growth.  The fetal heartbeat will be listened to.  Any test results from the previous visit will be discussed. Your health care provider may ask you:  How you are feeling.  If you are feeling the baby move.  If you have had any abnormal symptoms, such as leaking fluid, bleeding, severe headaches, or abdominal cramping.  If you have any questions. Other tests that may be performed during your second trimester include:  Blood tests that check for:  Low iron levels (anemia).  Gestational diabetes (between 24 and 28 weeks).  Rh antibodies.  Urine tests to check for infections, diabetes, or protein in the urine.  An ultrasound to confirm the proper growth and development of the baby.  An amniocentesis to check for possible genetic problems.  Fetal screens for spina bifida and Down syndrome. HOME CARE INSTRUCTIONS   Avoid all smoking, herbs, alcohol, and unprescribed   drugs. These chemicals affect the formation and growth of the baby.  Follow your health care provider's instructions regarding medicine use. There are medicines that are either safe or unsafe to take during pregnancy.  Exercise only as directed by your health care provider. Experiencing uterine cramps is a good sign to stop exercising.  Continue to eat regular, healthy meals.  Wear a good support bra for breast tenderness.  Do not use hot tubs, steam rooms, or saunas.  Wear your  seat belt at all times when driving.  Avoid raw meat, uncooked cheese, cat litter boxes, and soil used by cats. These carry germs that can cause birth defects in the baby.  Take your prenatal vitamins.  Try taking a stool softener (if your health care provider approves) if you develop constipation. Eat more high-fiber foods, such as fresh vegetables or fruit and whole grains. Drink plenty of fluids to keep your urine clear or pale yellow.  Take warm sitz baths to soothe any pain or discomfort caused by hemorrhoids. Use hemorrhoid cream if your health care provider approves.  If you develop varicose veins, wear support hose. Elevate your feet for 15 minutes, 3-4 times a day. Limit salt in your diet.  Avoid heavy lifting, wear low heel shoes, and practice good posture.  Rest with your legs elevated if you have leg cramps or low back pain.  Visit your dentist if you have not gone yet during your pregnancy. Use a soft toothbrush to brush your teeth and be gentle when you floss.  A sexual relationship may be continued unless your health care provider directs you otherwise.  Continue to go to all your prenatal visits as directed by your health care provider. SEEK MEDICAL CARE IF:   You have dizziness.  You have mild pelvic cramps, pelvic pressure, or nagging pain in the abdominal area.  You have persistent nausea, vomiting, or diarrhea.  You have a bad smelling vaginal discharge.  You have pain with urination. SEEK IMMEDIATE MEDICAL CARE IF:   You have a fever.  You are leaking fluid from your vagina.  You have spotting or bleeding from your vagina.  You have severe abdominal cramping or pain.  You have rapid weight gain or loss.  You have shortness of breath with chest pain.  You notice sudden or extreme swelling of your face, hands, ankles, feet, or legs.  You have not felt your baby move in over an hour.  You have severe headaches that do not go away with  medicine.  You have vision changes. Document Released: 05/18/2001 Document Revised: 05/29/2013 Document Reviewed: 07/25/2012 Exit[employer] Patient Information 2015 [employer]. This information is not intended to replace advice given to you by your health care provider. Make sure you discuss any questions you have with your health care provider.  

## 2014-01-15 NOTE — Progress Notes (Signed)
Low-risk OB appointment G3P1011 2246w1d Estimated Date of Delivery: 07/08/14 BP 114/60  Wt 178 lb (80.74 kg)  BP, weight, and urine reviewed.  Refer to obstetrical flow sheet for FH & FHR.  Denies carmping, lof, vb, or uti s/s. Some RLP. Needs phenergan refill, so refill sent.  Reviewed warning s/s to report. Plan:  Continue routine obstetrical care  F/U in 4wks for OB appointment and anatomy u/s 2nd IT today

## 2014-01-19 ENCOUNTER — Encounter: Payer: Self-pay | Admitting: Women's Health

## 2014-01-19 LAB — MATERNAL SCREEN, INTEGRATED #2
AFP MoM: 1.27
AFP, Serum: 33.4 ng/mL
CALCULATED GESTATIONAL AGE MAT SCREEN: 15.3
Crown Rump Length: 47.5 mm
Estriol Mom: 1.01
Estriol, Free: 0.63 ng/mL
HCG, MOM MAT SCREEN: 0.68
INHIBIN A DIMERIC MAT SCREEN: 424 pg/mL
INHIBIN A MOM MAT SCREEN: 2.58
MSS Down Syndrome: <1:5000 {titer}
NT MOM MAT SCREEN: 1.15
Nuchal Translucency: 1.37 mm
Number of fetuses: 1
PAPP-A MoM: 1.11
PAPP-A: 419 ng/mL
hCG, Serum: 25.8 IU/mL

## 2014-01-23 ENCOUNTER — Other Ambulatory Visit: Payer: Self-pay | Admitting: Obstetrics and Gynecology

## 2014-01-23 DIAGNOSIS — Z3492 Encounter for supervision of normal pregnancy, unspecified, second trimester: Secondary | ICD-10-CM

## 2014-01-23 MED ORDER — PRENATAL 27-0.8 MG PO TABS: 1.0000 | ORAL_TABLET | Freq: Every day | ORAL | Status: AC

## 2014-01-24 ENCOUNTER — Other Ambulatory Visit: Payer: Self-pay | Admitting: Obstetrics and Gynecology

## 2014-01-24 NOTE — Telephone Encounter (Signed)
[facility] called pt having Nausea and vomiting wants refill on phenergan will refill x 3

## 2014-01-25 ENCOUNTER — Telehealth: Payer: Self-pay | Admitting: Obstetrics and Gynecology

## 2014-01-25 NOTE — Telephone Encounter (Signed)
Pharmacy has been called; pt picked up Rx for promethazine and PNV's yesterday.

## 2014-02-12 ENCOUNTER — Ambulatory Visit (INDEPENDENT_AMBULATORY_CARE_PROVIDER_SITE_OTHER): Payer: Medicaid Other

## 2014-02-12 ENCOUNTER — Ambulatory Visit (INDEPENDENT_AMBULATORY_CARE_PROVIDER_SITE_OTHER): Payer: Medicaid Other | Admitting: Women's Health

## 2014-02-12 ENCOUNTER — Other Ambulatory Visit: Payer: Self-pay | Admitting: Women's Health

## 2014-02-12 ENCOUNTER — Encounter: Payer: Self-pay | Admitting: Women's Health

## 2014-02-12 VITALS — BP 122/58 | Wt 184.0 lb

## 2014-02-12 DIAGNOSIS — Z331 Pregnant state, incidental: Secondary | ICD-10-CM

## 2014-02-12 DIAGNOSIS — O34219 Maternal care for unspecified type scar from previous cesarean delivery: Secondary | ICD-10-CM

## 2014-02-12 DIAGNOSIS — Z3482 Encounter for supervision of other normal pregnancy, second trimester: Secondary | ICD-10-CM

## 2014-02-12 DIAGNOSIS — Z1389 Encounter for screening for other disorder: Secondary | ICD-10-CM

## 2014-02-12 DIAGNOSIS — O3090 Multiple gestation, unspecified, unspecified trimester: Secondary | ICD-10-CM

## 2014-02-12 DIAGNOSIS — O09299 Supervision of pregnancy with other poor reproductive or obstetric history, unspecified trimester: Secondary | ICD-10-CM

## 2014-02-12 DIAGNOSIS — O355XX1 Maternal care for (suspected) damage to fetus by drugs, fetus 1: Secondary | ICD-10-CM

## 2014-02-12 DIAGNOSIS — Z3480 Encounter for supervision of other normal pregnancy, unspecified trimester: Secondary | ICD-10-CM

## 2014-02-12 DIAGNOSIS — O355XX0 Maternal care for (suspected) damage to fetus by drugs, not applicable or unspecified: Secondary | ICD-10-CM

## 2014-02-12 LAB — POCT URINALYSIS DIPSTICK
GLUCOSE UA: NEGATIVE
Ketones, UA: NEGATIVE
NITRITE UA: NEGATIVE
Protein, UA: NEGATIVE
RBC UA: NEGATIVE

## 2014-02-12 NOTE — Patient Instructions (Signed)
Second Trimester of Pregnancy The second trimester is from week 13 through week 28, months 4 through 6. The second trimester is often a time when you feel your best. Your body has also adjusted to being pregnant, and you begin to feel better physically. Usually, morning sickness has lessened or quit completely, you may have more energy, and you may have an increase in appetite. The second trimester is also a time when the fetus is growing rapidly. At the end of the sixth month, the fetus is about 9 inches long and weighs about 1 pounds. You will likely begin to feel the baby move (quickening) between 18 and 20 weeks of the pregnancy. BODY CHANGES Your body goes through many changes during pregnancy. The changes vary from woman to woman.   Your weight will continue to increase. You will notice your lower abdomen bulging out.  You may begin to get stretch marks on your hips, abdomen, and breasts.  You may develop headaches that can be relieved by medicines approved by your health care provider.  You may urinate more often because the fetus is pressing on your bladder.  You may develop or continue to have heartburn as a result of your pregnancy.  You may develop constipation because certain hormones are causing the muscles that push waste through your intestines to slow down.  You may develop hemorrhoids or swollen, bulging veins (varicose veins).  You may have back pain because of the weight gain and pregnancy hormones relaxing your joints between the bones in your pelvis and as a result of a shift in weight and the muscles that support your balance.  Your breasts will continue to grow and be tender.  Your gums may bleed and may be sensitive to brushing and flossing.  Dark spots or blotches (chloasma, mask of pregnancy) may develop on your face. This will likely fade after the baby is born.  A dark line from your belly button to the pubic area (linea nigra) may appear. This will likely fade  after the baby is born.  You may have changes in your hair. These can include thickening of your hair, rapid growth, and changes in texture. Some women also have hair loss during or after pregnancy, or hair that feels dry or thin. Your hair will most likely return to normal after your baby is born. WHAT TO EXPECT AT YOUR PRENATAL VISITS During a routine prenatal visit:  You will be weighed to make sure you and the fetus are growing normally.  Your blood pressure will be taken.  Your abdomen will be measured to track your baby's growth.  The fetal heartbeat will be listened to.  Any test results from the previous visit will be discussed. Your health care provider may ask you:  How you are feeling.  If you are feeling the baby move.  If you have had any abnormal symptoms, such as leaking fluid, bleeding, severe headaches, or abdominal cramping.  If you have any questions. Other tests that may be performed during your second trimester include:  Blood tests that check for:  Low iron levels (anemia).  Gestational diabetes (between 24 and 28 weeks).  Rh antibodies.  Urine tests to check for infections, diabetes, or protein in the urine.  An ultrasound to confirm the proper growth and development of the baby.  An amniocentesis to check for possible genetic problems.  Fetal screens for spina bifida and Down syndrome. HOME CARE INSTRUCTIONS   Avoid all smoking, herbs, alcohol, and unprescribed   drugs. These chemicals affect the formation and growth of the baby.  Follow your health care provider's instructions regarding medicine use. There are medicines that are either safe or unsafe to take during pregnancy.  Exercise only as directed by your health care provider. Experiencing uterine cramps is a good sign to stop exercising.  Continue to eat regular, healthy meals.  Wear a good support bra for breast tenderness.  Do not use hot tubs, steam rooms, or saunas.  Wear your  seat belt at all times when driving.  Avoid raw meat, uncooked cheese, cat litter boxes, and soil used by cats. These carry germs that can cause birth defects in the baby.  Take your prenatal vitamins.  Try taking a stool softener (if your health care provider approves) if you develop constipation. Eat more high-fiber foods, such as fresh vegetables or fruit and whole grains. Drink plenty of fluids to keep your urine clear or pale yellow.  Take warm sitz baths to soothe any pain or discomfort caused by hemorrhoids. Use hemorrhoid cream if your health care provider approves.  If you develop varicose veins, wear support hose. Elevate your feet for 15 minutes, 3-4 times a day. Limit salt in your diet.  Avoid heavy lifting, wear low heel shoes, and practice good posture.  Rest with your legs elevated if you have leg cramps or low back pain.  Visit your dentist if you have not gone yet during your pregnancy. Use a soft toothbrush to brush your teeth and be gentle when you floss.  A sexual relationship may be continued unless your health care provider directs you otherwise.  Continue to go to all your prenatal visits as directed by your health care provider. SEEK MEDICAL CARE IF:   You have dizziness.  You have mild pelvic cramps, pelvic pressure, or nagging pain in the abdominal area.  You have persistent nausea, vomiting, or diarrhea.  You have a bad smelling vaginal discharge.  You have pain with urination. SEEK IMMEDIATE MEDICAL CARE IF:   You have a fever.  You are leaking fluid from your vagina.  You have spotting or bleeding from your vagina.  You have severe abdominal cramping or pain.  You have rapid weight gain or loss.  You have shortness of breath with chest pain.  You notice sudden or extreme swelling of your face, hands, ankles, feet, or legs.  You have not felt your baby move in over an hour.  You have severe headaches that do not go away with  medicine.  You have vision changes. Document Released: 05/18/2001 Document Revised: 05/29/2013 Document Reviewed: 07/25/2012 Exit[employer] Patient Information 2015 [employer]. This information is not intended to replace advice given to you by your health care provider. Make sure you discuss any questions you have with your health care provider.  

## 2014-02-12 NOTE — Progress Notes (Signed)
U/S(19+1wks)-single active fetus, meas c/w dates, fluid wnl, anterior Gr 0 placenta, cx appears closed (3.1cm), bilateral adnexa appears WNL, FHR-142 bpm, no obvious abnl noted

## 2014-02-12 NOTE — Progress Notes (Signed)
Low-risk OB appointment G3P1011 [redacted]w[redacted]d Estimated Date of Delivery: 07/08/14 BP 122/58  Wt 184 lb (83.462 kg)  BP, weight, and urine reviewed.  Refer to obstetrical flow sheet for FH & FHR.  Reports good fm.  Denies regular uc's, lof, vb, or uti s/s.Occ odor after sex, no itching/irriation or abnormal d/c. Thinks is r/t soaps she is using, b/c she switched soaps and it resolved. Declines exam/wet prep today. To let us know if anything changes.  Reviewed today's anatomy u/s, ptl s/s, fm. Plan:  Continue routine obstetrical care  F/U in 4wks for OB appointment

## 2014-03-12 ENCOUNTER — Encounter: Payer: Self-pay | Admitting: Women's Health

## 2014-03-12 ENCOUNTER — Ambulatory Visit (INDEPENDENT_AMBULATORY_CARE_PROVIDER_SITE_OTHER): Payer: Medicaid Other | Admitting: Women's Health

## 2014-03-12 ENCOUNTER — Encounter: Payer: Medicaid Other | Admitting: Women's Health

## 2014-03-12 ENCOUNTER — Ambulatory Visit (INDEPENDENT_AMBULATORY_CARE_PROVIDER_SITE_OTHER): Payer: Medicaid Other

## 2014-03-12 VITALS — BP 116/58 | Wt 200.0 lb

## 2014-03-12 DIAGNOSIS — O26892 Other specified pregnancy related conditions, second trimester: Secondary | ICD-10-CM

## 2014-03-12 DIAGNOSIS — Z9889 Other specified postprocedural states: Secondary | ICD-10-CM

## 2014-03-12 DIAGNOSIS — Z331 Pregnant state, incidental: Secondary | ICD-10-CM

## 2014-03-12 DIAGNOSIS — Z8742 Personal history of other diseases of the female genital tract: Secondary | ICD-10-CM | POA: Insufficient documentation

## 2014-03-12 DIAGNOSIS — N949 Unspecified condition associated with female genital organs and menstrual cycle: Secondary | ICD-10-CM

## 2014-03-12 DIAGNOSIS — Z1389 Encounter for screening for other disorder: Secondary | ICD-10-CM

## 2014-03-12 DIAGNOSIS — Z3482 Encounter for supervision of other normal pregnancy, second trimester: Secondary | ICD-10-CM

## 2014-03-12 DIAGNOSIS — R102 Pelvic and perineal pain: Secondary | ICD-10-CM

## 2014-03-12 DIAGNOSIS — Z23 Encounter for immunization: Secondary | ICD-10-CM

## 2014-03-12 LAB — POCT URINALYSIS DIPSTICK
GLUCOSE UA: NEGATIVE
Ketones, UA: NEGATIVE
Leukocytes, UA: NEGATIVE
NITRITE UA: NEGATIVE
PROTEIN UA: NEGATIVE
RBC UA: NEGATIVE

## 2014-03-12 LAB — POCT WET PREP (WET MOUNT): CLUE CELLS WET PREP WHIFF POC: POSITIVE

## 2014-03-12 MED ORDER — METRONIDAZOLE 500 MG PO TABS: 500.0000 mg | ORAL_TABLET | Freq: Two times a day (BID) | ORAL | Status: DC

## 2014-03-12 NOTE — Patient Instructions (Signed)
You will have your sugar test next visit.  Please do not eat or drink anything after midnight the night before you come, not even water.  You will be here for at least two hours.     Call the office ([phone]) or go to [facility] if:  You begin to have strong, frequent contractions  Your water breaks.  Sometimes it is a big gush of fluid, sometimes it is just a trickle that keeps getting your panties wet or running down your legs  You have vaginal bleeding.  It is normal to have a small amount of spotting if your cervix was checked.   You don't feel your baby moving like normal.  If you don't, get you something to eat and drink and lay down and focus on feeling your baby move.  You should feel at least 10 movements in 2 hours.  If you don't, you should call the office or go to [facility].    Preterm Labor Information Preterm labor is when labor starts at less than 37 weeks of pregnancy. The normal length of a pregnancy is 39 to 41 weeks. CAUSES Often, there is no identifiable underlying cause as to why a woman goes into preterm labor. One of the most common known causes of preterm labor is infection. Infections of the uterus, cervix, vagina, amniotic sac, bladder, kidney, or even the lungs (pneumonia) can cause labor to start. Other suspected causes of preterm labor include:   Urogenital infections, such as yeast infections and bacterial vaginosis.   Uterine abnormalities (uterine shape, uterine septum, fibroids, or bleeding from the placenta).   A cervix that has been operated on (it may fail to stay closed).   Malformations in the fetus.   Multiple gestations (twins, triplets, and so on).   Breakage of the amniotic sac.  RISK FACTORS  Having a previous history of preterm labor.   Having premature rupture of membranes (PROM).   Having a placenta that covers the opening of the cervix (placenta previa).   Having a placenta that separates from the uterus  (placental abruption).   Having a cervix that is too weak to hold the fetus in the uterus (incompetent cervix).   Having too much fluid in the amniotic sac (polyhydramnios).   Taking illegal drugs or smoking while pregnant.   Not gaining enough weight while pregnant.   Being younger than 18 and older than 29 years old.   Having a low socioeconomic status.   Being African American. SYMPTOMS Signs and symptoms of preterm labor include:   Menstrual-like cramps, abdominal pain, or back pain.  Uterine contractions that are regular, as frequent as six in an hour, regardless of their intensity (may be mild or painful).  Contractions that start on the top of the uterus and spread down to the lower abdomen and back.   A sense of increased pelvic pressure.   A watery or bloody mucus discharge that comes from the vagina.  TREATMENT Depending on the length of the pregnancy and other circumstances, your health care provider may suggest bed rest. If necessary, there are medicines that can be given to stop contractions and to mature the fetal lungs. If labor happens before 34 weeks of pregnancy, a prolonged hospital stay may be recommended. Treatment depends on the condition of both you and the fetus.  WHAT SHOULD YOU DO IF YOU THINK YOU ARE IN PRETERM LABOR? Call your health care provider right away. You will need to go to the hospital   to get checked immediately. HOW CAN YOU PREVENT PRETERM LABOR IN FUTURE PREGNANCIES? You should:   Stop smoking if you smoke.  Maintain healthy weight gain and avoid chemicals and drugs that are not necessary.  Be watchful for any type of infection.  Inform your health care provider if you have a known history of preterm labor. Document Released: 08/14/2003 Document Revised: 01/24/2013 Document Reviewed: 06/26/2012 [facility] Patient Information 2015 [employer], [place]. This information is not intended to replace advice given to you by your health  care provider. Make sure you discuss any questions you have with your health care provider.

## 2014-03-12 NOTE — Progress Notes (Signed)
U/S(23+1wks)-transvaginal u/s performed to ck cx, CX-2.5cm closed no funneling or any changes noted with fundal pressure applied

## 2014-03-12 NOTE — Progress Notes (Signed)
Low-risk OB appointment G3P1011 8072w1d Estimated Date of Delivery: 07/08/14 BP 116/58  Wt 200 lb (90.719 kg)  BP, weight, and urine reviewed.  Refer to obstetrical flow sheet for FH & FHR.  Reports good fm.  Denies regular uc's, lof, vb, or uti s/s. Lots of pressure, feels like she needs to push, worse when up on feet for awhile. Had a LEEP 3wks before she learned she was pregnant.  Spec exam: cx visually closed, mod amount thin white malodorous d/c, fFN and wet prep collected SVE: closed, longer on anterior portion, shorter on posterior portion- ? D/t h/o LEEP Wet prep: mod wbc's, few clues, will rx flagyl, and send urine for gc/ct Will get t[place] to [place] Reviewed ptl s/s, fm. Plan:  Continue routine obstetrical care  F/U in 4wks for OB appointment and pn2 Flu shot today  CL u/s: 2.5cm w/o funneling, continue current care- call if anything changes/worsens

## 2014-03-13 LAB — GC/CHLAMYDIA PROBE AMP
CT Probe RNA: NEGATIVE
GC Probe RNA: NEGATIVE

## 2014-03-28 ENCOUNTER — Telehealth: Payer: Self-pay | Admitting: *Deleted

## 2014-03-28 NOTE — Telephone Encounter (Signed)
Spoke with pt. Pt was wondering if she can take Multi Symptom Cold Relief. She states it comparable to Tylenol. I advised it was safe. Pt asked if she could take the drowsy formula. I advised that's fine, but it will cause drowsiness. Advised if this don't help with symptoms, call us back. Pt voiced understanding. JSY

## 2014-04-08 ENCOUNTER — Encounter: Payer: Self-pay | Admitting: Women's Health

## 2014-04-10 ENCOUNTER — Other Ambulatory Visit: Payer: Medicaid Other

## 2014-04-10 ENCOUNTER — Ambulatory Visit (INDEPENDENT_AMBULATORY_CARE_PROVIDER_SITE_OTHER): Payer: Medicaid Other | Admitting: Women's Health

## 2014-04-10 ENCOUNTER — Other Ambulatory Visit: Payer: Self-pay | Admitting: Women's Health

## 2014-04-10 VITALS — BP 106/64 | Wt 206.0 lb

## 2014-04-10 DIAGNOSIS — F1290 Cannabis use, unspecified, uncomplicated: Secondary | ICD-10-CM

## 2014-04-10 DIAGNOSIS — Z8742 Personal history of other diseases of the female genital tract: Secondary | ICD-10-CM

## 2014-04-10 DIAGNOSIS — F1210 Cannabis abuse, uncomplicated: Secondary | ICD-10-CM

## 2014-04-10 DIAGNOSIS — Z331 Pregnant state, incidental: Secondary | ICD-10-CM

## 2014-04-10 DIAGNOSIS — Z0184 Encounter for antibody response examination: Secondary | ICD-10-CM

## 2014-04-10 DIAGNOSIS — Z114 Encounter for screening for human immunodeficiency virus [HIV]: Secondary | ICD-10-CM

## 2014-04-10 DIAGNOSIS — Z1389 Encounter for screening for other disorder: Secondary | ICD-10-CM

## 2014-04-10 DIAGNOSIS — Z131 Encounter for screening for diabetes mellitus: Secondary | ICD-10-CM

## 2014-04-10 DIAGNOSIS — Z113 Encounter for screening for infections with a predominantly sexual mode of transmission: Secondary | ICD-10-CM

## 2014-04-10 DIAGNOSIS — Z3482 Encounter for supervision of other normal pregnancy, second trimester: Secondary | ICD-10-CM

## 2014-04-10 LAB — CBC
HEMATOCRIT: 34.3 % — AB (ref 36.0–46.0)
Hemoglobin: 11.7 g/dL — ABNORMAL LOW (ref 12.0–15.0)
MCH: 31.8 pg (ref 26.0–34.0)
MCHC: 34.1 g/dL (ref 30.0–36.0)
MCV: 93.2 fL (ref 78.0–100.0)
PLATELETS: 245 10*3/uL (ref 150–400)
RBC: 3.68 MIL/uL — ABNORMAL LOW (ref 3.87–5.11)
RDW: 12.5 % (ref 11.5–15.5)
WBC: 9.6 10*3/uL (ref 4.0–10.5)

## 2014-04-10 LAB — POCT URINALYSIS DIPSTICK
Blood, UA: NEGATIVE
Glucose, UA: NEGATIVE
KETONES UA: NEGATIVE
Leukocytes, UA: NEGATIVE
Nitrite, UA: NEGATIVE
PROTEIN UA: NEGATIVE

## 2014-04-10 NOTE — Patient Instructions (Signed)

## 2014-04-10 NOTE — Progress Notes (Addendum)
Low-risk OB appointment G3P1011 3231w2d Estimated Date of Delivery: 07/08/14 BP 106/64 mmHg  Wt 206 lb (93.441 kg)  BP, weight, and urine reviewed.  Refer to obstetrical flow sheet for FH & FHR.  Reports good fm.  Denies regular uc's, lof, vb, or uti s/s. No complaints. States [facility] called to schedule repeat colpo- notified them she was pregnant and they told her to f/u w/ us. Repeat colpo recommended-> pt would like to do at next visit.  Reviewed ptl s/s, fkc. Recommended Tdap at HD/PCP per CDC guidelines. Weight warning given, has gained 48lbs, states she gained 100+ last pregnancy.  Plan:  Continue routine obstetrical care  F/U in 3wks for OB appointment and Colpo PN2 today

## 2014-04-11 LAB — DRUG SCREEN, URINE, NO CONFIRMATION
AMPHETAMINE SCRN UR: NEGATIVE
BARBITURATE QUANT UR: NEGATIVE
Benzodiazepines.: NEGATIVE
COCAINE METABOLITES: NEGATIVE
Creatinine,U: 37.4 mg/dL
MARIJUANA METABOLITE: NEGATIVE
METHADONE: NEGATIVE
Opiate Screen, Urine: NEGATIVE
Phencyclidine (PCP): NEGATIVE
Propoxyphene: NEGATIVE

## 2014-04-11 LAB — RPR

## 2014-04-11 LAB — GLUCOSE TOLERANCE, 2 HOURS
Glucose, 2 hour: 71 mg/dL (ref 70–139)
Glucose, Fasting: 66 mg/dL — ABNORMAL LOW (ref 70–99)

## 2014-04-11 LAB — HSV 2 ANTIBODY, IGG: HSV 2 GLYCOPROTEIN G AB, IGG: 6.8 IV — AB

## 2014-04-11 LAB — HIV ANTIBODY (ROUTINE TESTING W REFLEX): HIV 1&2 Ab, 4th Generation: NONREACTIVE

## 2014-04-12 ENCOUNTER — Telehealth: Payer: Self-pay | Admitting: *Deleted

## 2014-04-13 ENCOUNTER — Encounter: Payer: Self-pay | Admitting: Women's Health

## 2014-04-13 DIAGNOSIS — R768 Other specified abnormal immunological findings in serum: Secondary | ICD-10-CM | POA: Insufficient documentation

## 2014-04-15 LAB — GLUCOSE TOLERANCE, 2 HOURS W/ 1HR
GLUCOSE, 2 HOUR: 71 mg/dL (ref 70–139)
Glucose, 1 hour: 97 mg/dL (ref 70–170)
Glucose, Fasting: 66 mg/dL — ABNORMAL LOW (ref 70–99)

## 2014-04-15 NOTE — Telephone Encounter (Signed)
Pt states spoke with Dr. Emelda FearFerguson in regards to Lactation. Pt c/o cramping, no vaginal bleeding, +FM. Pt informed to push water, can take tylenol if no improvement call our office back. Pt verbalized understanding.

## 2014-05-06 ENCOUNTER — Encounter: Payer: Medicaid Other | Admitting: Obstetrics & Gynecology

## 2014-05-07 ENCOUNTER — Encounter: Payer: Self-pay | Admitting: Women's Health

## 2014-05-07 ENCOUNTER — Ambulatory Visit (INDEPENDENT_AMBULATORY_CARE_PROVIDER_SITE_OTHER): Payer: Medicaid Other | Admitting: Women's Health

## 2014-05-07 VITALS — BP 108/58 | Wt 221.0 lb

## 2014-05-07 DIAGNOSIS — R894 Abnormal immunological findings in specimens from other organs, systems and tissues: Secondary | ICD-10-CM

## 2014-05-07 DIAGNOSIS — Z331 Pregnant state, incidental: Secondary | ICD-10-CM

## 2014-05-07 DIAGNOSIS — Z1389 Encounter for screening for other disorder: Secondary | ICD-10-CM

## 2014-05-07 DIAGNOSIS — N9489 Other specified conditions associated with female genital organs and menstrual cycle: Secondary | ICD-10-CM

## 2014-05-07 DIAGNOSIS — N898 Other specified noninflammatory disorders of vagina: Secondary | ICD-10-CM

## 2014-05-07 DIAGNOSIS — Z3493 Encounter for supervision of normal pregnancy, unspecified, third trimester: Secondary | ICD-10-CM

## 2014-05-07 DIAGNOSIS — R768 Other specified abnormal immunological findings in serum: Secondary | ICD-10-CM

## 2014-05-07 LAB — POCT WET PREP (WET MOUNT): Clue Cells Wet Prep Whiff POC: POSITIVE

## 2014-05-07 LAB — POCT URINALYSIS DIPSTICK
Blood, UA: NEGATIVE
Glucose, UA: NEGATIVE
KETONES UA: NEGATIVE
NITRITE UA: NEGATIVE
PROTEIN UA: NEGATIVE

## 2014-05-07 MED ORDER — METRONIDAZOLE 500 MG PO TABS: 500.0000 mg | ORAL_TABLET | Freq: Two times a day (BID) | ORAL | Status: DC

## 2014-05-07 NOTE — Progress Notes (Signed)
Low-risk OB appointment G3P1011 5516w1d Estimated Date of Delivery: 07/08/14 BP 108/58 mmHg  Wt 221 lb (100.245 kg)  BP, weight, and urine reviewed.  Refer to obstetrical flow sheet for FH & FHR.  Reports good fm.  Denies regular uc's, lof, vb, or uti s/s. Vaginal odor x 3 days. No itching/irritation. Had to cancel yesterday's appt w/ [facility] for colpo d/t son's appt.  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 Plan:  Continue routine obstetrical care  F/U in 2wks for OB appointment w/ [facility] for colpo and visit, still not sure if she wants to do colpo- to discuss w/ him further

## 2014-05-07 NOTE — Patient Instructions (Signed)
Call the office ([phone]) or go to [facility] if:  You begin to have strong, frequent contractions  Your water breaks.  Sometimes it is a big gush of fluid, sometimes it is just a trickle that keeps getting your panties wet or running down your legs  You have vaginal bleeding.  It is normal to have a small amount of spotting if your cervix was checked.   You don't feel your baby moving like normal.  If you don't, get you something to eat and drink and lay down and focus on feeling your baby move.  You should feel at least 10 movements in 2 hours.  If you don't, you should call the office or go to [facility].    Preterm Labor Information Preterm labor is when labor starts at less than 37 weeks of pregnancy. The normal length of a pregnancy is 39 to 41 weeks. CAUSES Often, there is no identifiable underlying cause as to why a woman goes into preterm labor. One of the most common known causes of preterm labor is infection. Infections of the uterus, cervix, vagina, amniotic sac, bladder, kidney, or even the lungs (pneumonia) can cause labor to start. Other suspected causes of preterm labor include:   Urogenital infections, such as yeast infections and bacterial vaginosis.   Uterine abnormalities (uterine shape, uterine septum, fibroids, or bleeding from the placenta).   A cervix that has been operated on (it may fail to stay closed).   Malformations in the fetus.   Multiple gestations (twins, triplets, and so on).   Breakage of the amniotic sac.  RISK FACTORS  Having a previous history of preterm labor.   Having premature rupture of membranes (PROM).   Having a placenta that covers the opening of the cervix (placenta previa).   Having a placenta that separates from the uterus (placental abruption).   Having a cervix that is too weak to hold the fetus in the uterus (incompetent cervix).   Having too much fluid in the amniotic sac (polyhydramnios).   Taking  illegal drugs or smoking while pregnant.   Not gaining enough weight while pregnant.   Being younger than 18 and older than 29 years old.   Having a low socioeconomic status.   Being African American. SYMPTOMS Signs and symptoms of preterm labor include:   Menstrual-like cramps, abdominal pain, or back pain.  Uterine contractions that are regular, as frequent as six in an hour, regardless of their intensity (may be mild or painful).  Contractions that start on the top of the uterus and spread down to the lower abdomen and back.   A sense of increased pelvic pressure.   A watery or bloody mucus discharge that comes from the vagina.  TREATMENT Depending on the length of the pregnancy and other circumstances, your health care provider may suggest bed rest. If necessary, there are medicines that can be given to stop contractions and to mature the fetal lungs. If labor happens before 34 weeks of pregnancy, a prolonged hospital stay may be recommended. Treatment depends on the condition of both you and the fetus.  WHAT SHOULD YOU DO IF YOU THINK YOU ARE IN PRETERM LABOR? Call your health care provider right away. You will need to go to the hospital to get checked immediately. HOW CAN YOU PREVENT PRETERM LABOR IN FUTURE PREGNANCIES? You should:   Stop smoking if you smoke.  Maintain healthy weight gain and avoid chemicals and drugs that are not necessary.  Be watchful for   any type of infection.  Inform your health care provider if you have a known history of preterm labor. Document Released: 08/14/2003 Document Revised: 01/24/2013 Document Reviewed: 06/26/2012 [employer] Patient Information 2015 [employer]. This information is not intended to replace advice given to you by your health care provider. Make sure you discuss any questions you have with your health care provider.  

## 2014-05-21 ENCOUNTER — Ambulatory Visit (INDEPENDENT_AMBULATORY_CARE_PROVIDER_SITE_OTHER): Payer: Medicaid Other | Admitting: Obstetrics & Gynecology

## 2014-05-21 ENCOUNTER — Encounter: Payer: Self-pay | Admitting: Obstetrics & Gynecology

## 2014-05-21 VITALS — BP 110/60 | Wt 224.0 lb

## 2014-05-21 DIAGNOSIS — Z3493 Encounter for supervision of normal pregnancy, unspecified, third trimester: Secondary | ICD-10-CM

## 2014-05-21 NOTE — Progress Notes (Signed)
[id number] 7034w1d Estimated Date of Delivery: 07/08/14  Blood pressure 110/60, weight 224 lb (101.606 kg).   BP weight and urine results all reviewed and noted.  Please refer to the obstetrical flow sheet for the fundal height and fetal heart rate documentation:  Patient reports good fetal movement, denies any bleeding and no rupture of membranes symptoms or regular contractions. Patient is without complaints. All questions were answered.  Plan:  Continued routine obstetrical care,   Follow up in 2 weeks for OB appointment, routine  Colposcopy was normal no biopsy taken, repeat pap in 6 months from now

## 2014-06-05 ENCOUNTER — Ambulatory Visit (INDEPENDENT_AMBULATORY_CARE_PROVIDER_SITE_OTHER): Payer: Medicaid Other | Admitting: Obstetrics and Gynecology

## 2014-06-05 ENCOUNTER — Encounter: Payer: Self-pay | Admitting: Obstetrics and Gynecology

## 2014-06-05 VITALS — BP 108/60 | Wt 230.0 lb

## 2014-06-05 DIAGNOSIS — O3421 Maternal care for scar from previous cesarean delivery: Secondary | ICD-10-CM

## 2014-06-05 DIAGNOSIS — Z3483 Encounter for supervision of other normal pregnancy, third trimester: Secondary | ICD-10-CM

## 2014-06-05 DIAGNOSIS — O34219 Maternal care for unspecified type scar from previous cesarean delivery: Secondary | ICD-10-CM

## 2014-06-05 NOTE — Progress Notes (Signed)
[id number] 3616w2d Estimated Date of Delivery: 07/08/14  Blood pressure 108/60, weight 230 lb (104.327 kg).   refer to the ob flow sheet for FH and FHR (152 bpm), also BP, Wt, Urine results:notable for nothing  Patient reports good fetal movement, denies any bleeding and no rupture of membranes symptoms or regular contractions. Patient complaints:nothing other than increased pressure.  Questions were answered. Assessment: pregnancy , repeat cesarean scheduled for 7:30 am on 07/01/14 for jvferguson. Plan:  Continued routine obstetrical care,   F/u in 1 week for routine prenatal visit  GBS NEEDED  This chart was scribed by Leone PayorSonum Patel, Medical Scribe, for Dr. Christin BachJohn Rothlisberger on 06/05/14 at 9:55 AM. This chart was reviewed by Dr. Christin BachJohn Arterberry for accuracy.

## 2014-06-05 NOTE — Progress Notes (Signed)
Pt states that she is having a lot of pressure and that her stomach tightens and she has cramping. Pt states that the baby's movement has decreased some.

## 2014-06-12 ENCOUNTER — Encounter: Payer: Self-pay | Admitting: Obstetrics and Gynecology

## 2014-06-12 ENCOUNTER — Ambulatory Visit (INDEPENDENT_AMBULATORY_CARE_PROVIDER_SITE_OTHER): Payer: Medicaid Other | Admitting: Obstetrics and Gynecology

## 2014-06-12 VITALS — BP 100/60 | Wt 231.0 lb

## 2014-06-12 DIAGNOSIS — Z118 Encounter for screening for other infectious and parasitic diseases: Secondary | ICD-10-CM

## 2014-06-12 DIAGNOSIS — Z1159 Encounter for screening for other viral diseases: Secondary | ICD-10-CM

## 2014-06-12 DIAGNOSIS — K219 Gastro-esophageal reflux disease without esophagitis: Secondary | ICD-10-CM | POA: Insufficient documentation

## 2014-06-12 DIAGNOSIS — Z3685 Encounter for antenatal screening for Streptococcus B: Secondary | ICD-10-CM

## 2014-06-12 DIAGNOSIS — Z3483 Encounter for supervision of other normal pregnancy, third trimester: Secondary | ICD-10-CM

## 2014-06-12 DIAGNOSIS — Z1389 Encounter for screening for other disorder: Secondary | ICD-10-CM

## 2014-06-12 DIAGNOSIS — Z331 Pregnant state, incidental: Secondary | ICD-10-CM

## 2014-06-12 LAB — POCT URINALYSIS DIPSTICK
Blood, UA: NEGATIVE
GLUCOSE UA: NEGATIVE
Ketones, UA: NEGATIVE
Leukocytes, UA: NEGATIVE
Nitrite, UA: NEGATIVE
PROTEIN UA: NEGATIVE

## 2014-06-12 MED ORDER — OMEPRAZOLE 20 MG PO CPDR: 20.0000 mg | DELAYED_RELEASE_CAPSULE | Freq: Every day | ORAL | Status: AC

## 2014-06-12 NOTE — Progress Notes (Signed)
Pt denies any problems or concerns at this time.  

## 2014-06-12 NOTE — Progress Notes (Signed)
[id number] 160w2d Estimated Date of Delivery: 07/08/14  Blood pressure 100/60, weight 231 lb (104.781 kg).   refer to the ob flow sheet for FH and FHR, also BP, Wt, Urine results: notable for negative  Patient reports good fetal movement, denies any bleeding and no rupture of membranes symptoms or regular contractions. Patient complaints: She will be going for a repeat C-Section on 07/01/14. She reports that she has not slept well last night. She notes that she has Acid Reflux to where it is unbearable.   Fundal Height: 39 cm Fetal Heart Rate: 147  Physical Examination:  Pelvic - CERVIX: normal appearing cervix without discharge or lesions, long, closed, posterior, vertex well applied.   Questions were answered. Assessment:  1. 1460w2d prior cesarean for repeat 1/25 2 GERD   Plan:   1. Continued routine obstetrical care 2. Group B Strep completed today. 3. Omeprazole Rx.   F/u in 1 week for routine OB care  This chart was scribed for Christy BurrowJohn Ross V, MD by Chestine SporeSoijett Blue, ED Scribe. The patient was seen in room 2 at 9:36 AM.

## 2014-06-13 LAB — GC/CHLAMYDIA PROBE AMP
CT Probe RNA: NEGATIVE
GC Probe RNA: NEGATIVE

## 2014-06-13 LAB — STREP B DNA PROBE: STREP GROUP B AG: NOT DETECTED

## 2014-06-16 ENCOUNTER — Encounter (HOSPITAL_COMMUNITY): Payer: Self-pay

## 2014-06-16 ENCOUNTER — Inpatient Hospital Stay (HOSPITAL_COMMUNITY)
Admission: AD | Admit: 2014-06-16 | Discharge: 2014-06-16 | Disposition: A | Discharge status: Home / Self Care | Payer: Medicaid Other | Source: Ambulatory Visit | Attending: Obstetrics & Gynecology | Admitting: Obstetrics & Gynecology

## 2014-06-16 DIAGNOSIS — O99333 Smoking (tobacco) complicating pregnancy, third trimester: Secondary | ICD-10-CM | POA: Insufficient documentation

## 2014-06-16 DIAGNOSIS — O368130 Decreased fetal movements, third trimester, not applicable or unspecified: Secondary | ICD-10-CM | POA: Insufficient documentation

## 2014-06-16 DIAGNOSIS — Z3A36 36 weeks gestation of pregnancy: Secondary | ICD-10-CM | POA: Diagnosis not present

## 2014-06-16 DIAGNOSIS — F17210 Nicotine dependence, cigarettes, uncomplicated: Secondary | ICD-10-CM | POA: Diagnosis not present

## 2014-06-16 NOTE — MAU Note (Signed)
Pt presents complaining of no fetal movement since last night. Denies vaginal bleeding, leaking or discharge.

## 2014-06-16 NOTE — Progress Notes (Signed)
Notified of pt arrival in MAU and complaint. Will come see pt 

## 2014-06-16 NOTE — MAU Provider Note (Signed)
  History   G3P1011 at 36.6 wks in with decreased fetal movement today. Since admission on unit fetus is now moving.  CSN: [id number]  Arrival date and time: 06/16/14 1920   None     Chief Complaint  Patient presents with  . Decreased Fetal Movement   HPI  OB History    Gravida Para Term Preterm AB TAB SAB Ectopic Multiple Living   3 1 1  0 1 0 1 0 0 1      Past Medical History  Diagnosis Date  . Asthma     Past Surgical History  Procedure Laterality Date  . Cesarean section  03/26/2008    Family History  Problem Relation Age of Onset  . Multiple sclerosis Mother   . Hypertension Father   . Hyperlipidemia Father   . Diabetes Father   . Cancer Paternal Grandmother     breast  . Cancer Paternal Grandfather     History  Substance Use Topics  . Smoking status: Current Every Day Smoker -- 0.25 packs/day    Types: Cigarettes  . Smokeless tobacco: Never Used  . Alcohol Use: No    Allergies: No Known Allergies  Prescriptions prior to admission  Medication Sig Dispense Refill Last Dose  . albuterol (PROVENTIL HFA;VENTOLIN HFA) 108 (90 BASE) MCG/ACT inhaler Inhale 2 puffs into the lungs every 6 (six) hours as needed for wheezing or shortness of breath.   rescue  . albuterol (PROVENTIL) (2.5 MG/3ML) 0.083% nebulizer solution Take 2.5 mg by nebulization every 6 (six) hours as needed for wheezing or shortness of breath.   rescue  . omeprazole (PRILOSEC) 20 MG capsule Take 1 capsule (20 mg total) by mouth daily. 30 capsule 6 Past Week at Unknown time  . Prenatal Vit-Fe Fumarate-FA (MULTIVITAMIN-PRENATAL) 27-0.8 MG TABS tablet Take 1 tablet by mouth daily at 12 noon. 30 each prn 06/16/2014 at Unknown time  . acetaminophen (TYLENOL) 500 MG tablet Take 1,000 mg by mouth every 6 (six) hours as needed for headache.   prn    Review of Systems  Constitutional: Negative.   HENT: Negative.   Eyes: Negative.   Respiratory: Negative.   Cardiovascular: Negative.    Gastrointestinal: Negative.   Genitourinary: Negative.   Musculoskeletal: Negative.   Skin: Negative.   Neurological: Negative.   Endo/Heme/Allergies: Negative.   Psychiatric/Behavioral: Negative.    Physical Exam   Blood pressure 119/67, pulse 90, temperature 97.4 F (36.3 C), temperature source Oral, resp. rate 18.  Physical Exam  Constitutional: She is oriented to person, place, and time. She appears well-developed and well-nourished.  HENT:  Head: Normocephalic.  Neck: Normal range of motion.  Cardiovascular: Normal rate, regular rhythm, normal heart sounds and intact distal pulses.   Respiratory: Effort normal and breath sounds normal.  GI: Soft. Bowel sounds are normal.  Genitourinary: Vagina normal and uterus normal.  Musculoskeletal: Normal range of motion.  Neurological: She is alert and oriented to person, place, and time. She has normal reflexes.  Skin: Skin is warm and dry.  Psychiatric: She has a normal mood and affect. Her behavior is normal. Judgment and thought content normal.    MAU Course  Procedures  MDM D/c home  Assessment and Plan  Reactive NST with fetal movement noted  LAWSON, MARIE DARLENE 06/16/2014, 8:21 PM

## 2014-06-19 ENCOUNTER — Ambulatory Visit (INDEPENDENT_AMBULATORY_CARE_PROVIDER_SITE_OTHER): Payer: Medicaid Other | Admitting: Obstetrics and Gynecology

## 2014-06-19 ENCOUNTER — Encounter: Payer: Self-pay | Admitting: Obstetrics and Gynecology

## 2014-06-19 DIAGNOSIS — Z331 Pregnant state, incidental: Secondary | ICD-10-CM

## 2014-06-19 DIAGNOSIS — Z3483 Encounter for supervision of other normal pregnancy, third trimester: Secondary | ICD-10-CM

## 2014-06-19 DIAGNOSIS — Z1389 Encounter for screening for other disorder: Secondary | ICD-10-CM

## 2014-06-19 LAB — POCT URINALYSIS DIPSTICK
Blood, UA: NEGATIVE
GLUCOSE UA: NEGATIVE
Ketones, UA: NEGATIVE
LEUKOCYTES UA: NEGATIVE
NITRITE UA: NEGATIVE
Protein, UA: NEGATIVE

## 2014-06-19 NOTE — Progress Notes (Signed)
Pt denies any problems or concerns at this time.  

## 2014-06-26 ENCOUNTER — Other Ambulatory Visit: Payer: Self-pay | Admitting: Obstetrics and Gynecology

## 2014-06-26 ENCOUNTER — Encounter: Payer: Self-pay | Admitting: Obstetrics and Gynecology

## 2014-06-26 ENCOUNTER — Ambulatory Visit (INDEPENDENT_AMBULATORY_CARE_PROVIDER_SITE_OTHER): Payer: Medicaid Other | Admitting: Obstetrics and Gynecology

## 2014-06-26 VITALS — BP 120/80 | Wt 235.0 lb

## 2014-06-26 DIAGNOSIS — Z1389 Encounter for screening for other disorder: Secondary | ICD-10-CM

## 2014-06-26 DIAGNOSIS — Z331 Pregnant state, incidental: Secondary | ICD-10-CM

## 2014-06-26 DIAGNOSIS — Z3493 Encounter for supervision of normal pregnancy, unspecified, third trimester: Secondary | ICD-10-CM

## 2014-06-26 LAB — POCT URINALYSIS DIPSTICK
Glucose, UA: NEGATIVE
KETONES UA: NEGATIVE
Leukocytes, UA: NEGATIVE
NITRITE UA: NEGATIVE
Protein, UA: NEGATIVE
RBC UA: NEGATIVE

## 2014-06-26 NOTE — Progress Notes (Signed)
[id number] 6987w2d Estimated Date of Delivery: 07/08/14  There were no vitals taken for this visit.   refer to the ob flow sheet for FH and FHR, also BP, Wt, Urine results:notable for negative  Patient reports good fetal movement, denies any bleeding and no rupture of membranes symptoms or regular contractions. Patient complaints: she denies any complaints at this time.  Fundal Height: 38 Fetal Heart Rate: 150  Questions were answered. Lengthy discussion of scar revision discussed, including alternative, need for drain postop, increased risk of bleeding or infection with larger incision, and possible incision size sketched out . Assessment:  1. 4687w2d [facility]  Plan:   1. Continued routine obstetrical care 2. C-Section planned for 07/01/14 at 7:30 AM  F/u in 12 days for pp visit  This chart was scribed for Tilda BurrowJohn Traber V, MD by Chestine SporeSoijett Blue, ED Scribe. The patient was seen in room 2 at 10:45 AM.

## 2014-06-26 NOTE — Progress Notes (Signed)
Pt denies any problems or concerns at this time.  

## 2014-06-26 NOTE — H&P (Signed)
Christy Ross is a 29 y.o. female presenting for REPEAT CESAREAN SECTION, WIDE EXCISION OF INCISION SCAR.Marland Kitchen. History OB History    Gravida Para Term Preterm AB TAB SAB Ectopic Multiple Living   3 1 1  0 1 0 1 0 0 1     Past Medical History  Diagnosis Date  . Asthma    Past Surgical History  Procedure Laterality Date  . Cesarean section  03/26/2008   Family History: family history includes Cancer in her paternal grandfather and paternal grandmother; Diabetes in her father; Hyperlipidemia in her father; Hypertension in her father; Multiple sclerosis in her mother. Social History:  reports that she has been smoking Cigarettes.  She has been smoking about 0.25 packs per day. She has never used smokeless tobacco. She reports that she does not drink alcohol or use illicit drugs.   Prenatal Transfer Tool  Maternal Diabetes: No Genetic Screening: Normal Maternal Ultrasounds/Referrals: Normal Fetal Ultrasounds or other Referrals:  None Maternal Substance Abuse:  No Significant Maternal Medications:  None Significant Maternal Lab Results:  Lab values include: Group B Strep negative Other Comments:  None  ROS    There were no vitals taken for this visit. Maternal Exam:  Abdomen: Patient reports no abdominal tenderness. Surgical scars: low transverse.   Fundal height is 38.   Estimated fetal weight is 8.   Fetal presentation: vertex  Introitus: Normal vulva. Normal vagina.  Ferning test: not done.  Nitrazine test: not done.     Physical Exam  Constitutional: She is oriented to person, place, and time. She appears well-developed and well-nourished.  HENT:  Head: Normocephalic.  Eyes: Pupils are equal, round, and reactive to light.  Neck: Normal range of motion.  Cardiovascular: Normal rate.   Respiratory: Effort normal.  GI: Soft.  Musculoskeletal: Normal range of motion.  Neurological: She is alert and oriented to person, place, and time. She has normal reflexes. No cranial  nerve deficit.  Skin: Skin is warm and dry.  Psychiatric: She has a normal mood and affect. Her behavior is normal. Judgment and thought content normal.    Prenatal labs: ABO, Rh: O/POS/-- (06/16 1436) Antibody: NEG (06/16 1436) Rubella: 1.36 (06/16 1436) RPR: NON REAC (11/04 1024)  HBsAg: NEGATIVE (06/16 1436)  HIV: NONREACTIVE (11/04 1024)  GBS: NOT DETECTED (01/06 1110)   Assessment/Plan: PREGNANCY 39WKS PRIOR CESAREAN SECTION, DECLINING TOLAC PRIOR SCAR FOR PLANNED REVISION PLAN:REPEAT CESAREAN WITH SCAR REVISION 07/01/2014  Tilda BurrowFERGUSON,Christy Ross 06/26/2014, 3:42 PM

## 2014-06-27 ENCOUNTER — Encounter (HOSPITAL_COMMUNITY): Payer: Self-pay

## 2014-06-27 NOTE — Patient Instructions (Addendum)
   Your procedure is scheduled on:  Monday, Jan 25  [facility] 6 AM Pick up the phone at the desk and dial [phone] and inform us of your arrival.  Please call this number if you have any problems the morning of surgery: [phone]  Remember: Do not eat or drink after midnight: Sunday Take these medicines the morning of surgery with a SIP OF WATER: None.  Bring albuterol inhaler with you on day of surgery.  Do not wear jewelry, make-up, or FINGER nail polish No metal in your hair or on your body. Do not wear lotions, powders, perfumes.  You may wear deodorant.  Do not bring valuables to the hospital. Contacts, dentures or bridgework may not be worn into surgery.  Leave suitcase in the car. After Surgery it may be brought to your room. For patients being admitted to the hospital, checkout time is 11:00am the day of discharge.  Home with fiance Nedra HaiLee cell [phone]

## 2014-06-28 ENCOUNTER — Encounter (HOSPITAL_COMMUNITY)
Admission: RE | Admit: 2014-06-28 | Discharge: 2014-06-28 | Disposition: A | Discharge status: Home / Self Care | Payer: Medicaid Other | Source: Ambulatory Visit | Attending: Obstetrics and Gynecology | Admitting: Obstetrics and Gynecology

## 2014-06-28 ENCOUNTER — Encounter (HOSPITAL_COMMUNITY): Payer: Self-pay

## 2014-06-28 VITALS — BP 99/65 | HR 100 | Resp 18 | Ht 65.0 in | Wt 236.0 lb

## 2014-06-28 DIAGNOSIS — O34219 Maternal care for unspecified type scar from previous cesarean delivery: Secondary | ICD-10-CM

## 2014-06-28 DIAGNOSIS — F1290 Cannabis use, unspecified, uncomplicated: Secondary | ICD-10-CM

## 2014-06-28 HISTORY — DX: Missed abortion: O02.1

## 2014-06-28 HISTORY — DX: Headache: R51

## 2014-06-28 HISTORY — DX: Heartburn: R12

## 2014-06-28 HISTORY — DX: Headache, unspecified: R51.9

## 2014-06-28 HISTORY — DX: Personal history of urinary calculi: Z87.442

## 2014-06-28 HISTORY — DX: Other specified pregnancy related conditions, unspecified trimester: O26.899

## 2014-06-28 LAB — CBC
HCT: 35.2 % — ABNORMAL LOW (ref 36.0–46.0)
Hemoglobin: 12 g/dL (ref 12.0–15.0)
MCH: 31.3 pg (ref 26.0–34.0)
MCHC: 34.1 g/dL (ref 30.0–36.0)
MCV: 91.9 fL (ref 78.0–100.0)
PLATELETS: 212 10*3/uL (ref 150–400)
RBC: 3.83 MIL/uL — ABNORMAL LOW (ref 3.87–5.11)
RDW: 12.8 % (ref 11.5–15.5)
WBC: 8.4 10*3/uL (ref 4.0–10.5)

## 2014-06-28 LAB — ABO/RH: ABO/RH(D): O POS

## 2014-06-29 LAB — RPR: RPR Ser Ql: NONREACTIVE

## 2014-07-01 ENCOUNTER — Encounter (HOSPITAL_COMMUNITY)
Admission: RE | Disposition: A | Discharge status: Home / Self Care | Payer: Self-pay | Source: Ambulatory Visit | Attending: Obstetrics and Gynecology

## 2014-07-01 ENCOUNTER — Inpatient Hospital Stay (HOSPITAL_COMMUNITY)
Admission: RE | Admit: 2014-07-01 | Discharge: 2014-07-03 | DRG: 766 | Disposition: A | Discharge status: Home / Self Care | Payer: Medicaid Other | Source: Ambulatory Visit | Attending: Obstetrics and Gynecology | Admitting: Obstetrics and Gynecology

## 2014-07-01 ENCOUNTER — Encounter (HOSPITAL_COMMUNITY): Payer: Self-pay | Admitting: *Deleted

## 2014-07-01 ENCOUNTER — Inpatient Hospital Stay (HOSPITAL_COMMUNITY): Payer: Medicaid Other | Admitting: Anesthesiology

## 2014-07-01 DIAGNOSIS — Z3A39 39 weeks gestation of pregnancy: Secondary | ICD-10-CM | POA: Diagnosis present

## 2014-07-01 DIAGNOSIS — Z98891 History of uterine scar from previous surgery: Secondary | ICD-10-CM

## 2014-07-01 DIAGNOSIS — O3421 Maternal care for scar from previous cesarean delivery: Secondary | ICD-10-CM | POA: Diagnosis present

## 2014-07-01 DIAGNOSIS — Z3483 Encounter for supervision of other normal pregnancy, third trimester: Secondary | ICD-10-CM | POA: Diagnosis present

## 2014-07-01 DIAGNOSIS — F1290 Cannabis use, unspecified, uncomplicated: Secondary | ICD-10-CM

## 2014-07-01 DIAGNOSIS — O34219 Maternal care for unspecified type scar from previous cesarean delivery: Secondary | ICD-10-CM | POA: Diagnosis present

## 2014-07-01 LAB — PREPARE RBC (CROSSMATCH)

## 2014-07-01 SURGERY — Surgical Case
Anesthesia: Spinal

## 2014-07-01 MED ORDER — NALOXONE HCL 0.4 MG/ML IJ SOLN: 0.4000 mg | INTRAMUSCULAR | Status: DC | PRN

## 2014-07-01 MED ORDER — PANTOPRAZOLE SODIUM 40 MG PO TBEC
40.0000 mg | DELAYED_RELEASE_TABLET | Freq: Every day | ORAL | Status: DC
  Administered 2014-07-01 – 2014-07-03 (×3): 40 mg via ORAL
  Filled 2014-07-01 (×3): qty 1

## 2014-07-01 MED ORDER — KETOROLAC TROMETHAMINE 30 MG/ML IJ SOLN
30.0000 mg | Freq: Four times a day (QID) | INTRAMUSCULAR | Status: AC | PRN
Start: 2014-07-01 — End: 2014-07-02

## 2014-07-01 MED ORDER — IBUPROFEN 600 MG PO TABS: 600.0000 mg | ORAL_TABLET | Freq: Four times a day (QID) | ORAL | Status: DC | PRN

## 2014-07-01 MED ORDER — LANOLIN HYDROUS EX OINT: 1.0000 "application " | TOPICAL_OINTMENT | CUTANEOUS | Status: DC | PRN

## 2014-07-01 MED ORDER — PHENYLEPHRINE 8 MG IN D5W 100 ML (0.08MG/ML) PREMIX OPTIME
INJECTION | INTRAVENOUS | Status: AC
  Filled 2014-07-01: qty 100

## 2014-07-01 MED ORDER — KETOROLAC TROMETHAMINE 30 MG/ML IJ SOLN
INTRAMUSCULAR | Status: AC
  Administered 2014-07-01: 30 mg via INTRAVENOUS
  Filled 2014-07-01: qty 1

## 2014-07-01 MED ORDER — DIBUCAINE 1 % RE OINT: 1.0000 "application " | TOPICAL_OINTMENT | RECTAL | Status: DC | PRN

## 2014-07-01 MED ORDER — NALBUPHINE HCL 10 MG/ML IJ SOLN: 5.0000 mg | Freq: Once | INTRAMUSCULAR | Status: AC | PRN

## 2014-07-01 MED ORDER — IBUPROFEN 600 MG PO TABS
600.0000 mg | ORAL_TABLET | Freq: Four times a day (QID) | ORAL | Status: DC
  Administered 2014-07-01 – 2014-07-03 (×8): 600 mg via ORAL
  Filled 2014-07-01 (×8): qty 1

## 2014-07-01 MED ORDER — OXYTOCIN 10 UNIT/ML IJ SOLN
INTRAMUSCULAR | Status: DC | PRN
  Administered 2014-07-01: 40 [IU] via INTRAMUSCULAR

## 2014-07-01 MED ORDER — ALBUTEROL SULFATE (2.5 MG/3ML) 0.083% IN NEBU: 2.5000 mg | INHALATION_SOLUTION | Freq: Four times a day (QID) | RESPIRATORY_TRACT | Status: DC | PRN

## 2014-07-01 MED ORDER — MEPERIDINE HCL 25 MG/ML IJ SOLN: 6.2500 mg | INTRAMUSCULAR | Status: DC | PRN

## 2014-07-01 MED ORDER — SODIUM CHLORIDE 0.9 % IJ SOLN: 3.0000 mL | INTRAMUSCULAR | Status: DC | PRN

## 2014-07-01 MED ORDER — MORPHINE SULFATE (PF) 0.5 MG/ML IJ SOLN
INTRAMUSCULAR | Status: DC | PRN
  Administered 2014-07-01: .15 mg via INTRATHECAL

## 2014-07-01 MED ORDER — KETOROLAC TROMETHAMINE 30 MG/ML IJ SOLN: 30.0000 mg | Freq: Four times a day (QID) | INTRAMUSCULAR | Status: AC | PRN

## 2014-07-01 MED ORDER — NALOXONE HCL 1 MG/ML IJ SOLN
1.0000 ug/kg/h | INTRAVENOUS | Status: DC | PRN
  Filled 2014-07-01: qty 2

## 2014-07-01 MED ORDER — KETOROLAC TROMETHAMINE 30 MG/ML IJ SOLN
30.0000 mg | Freq: Once | INTRAMUSCULAR | Status: AC | PRN
  Administered 2014-07-01: 30 mg via INTRAVENOUS

## 2014-07-01 MED ORDER — LACTATED RINGERS IV SOLN
INTRAVENOUS | Status: DC
  Administered 2014-07-01: 07:00:00 via INTRAVENOUS

## 2014-07-01 MED ORDER — ACETAMINOPHEN 500 MG PO TABS
1000.0000 mg | ORAL_TABLET | Freq: Four times a day (QID) | ORAL | Status: AC
  Filled 2014-07-01: qty 2

## 2014-07-01 MED ORDER — SCOPOLAMINE 1 MG/3DAYS TD PT72
MEDICATED_PATCH | TRANSDERMAL | Status: AC
  Administered 2014-07-01: 1.5 mg via TRANSDERMAL
  Filled 2014-07-01: qty 1

## 2014-07-01 MED ORDER — ONDANSETRON HCL 4 MG/2ML IJ SOLN: 4.0000 mg | INTRAMUSCULAR | Status: DC | PRN

## 2014-07-01 MED ORDER — FENTANYL CITRATE 0.05 MG/ML IJ SOLN
INTRAMUSCULAR | Status: AC
  Filled 2014-07-01: qty 2

## 2014-07-01 MED ORDER — LACTATED RINGERS IV SOLN
Freq: Once | INTRAVENOUS | Status: AC
  Administered 2014-07-01: 06:00:00 via INTRAVENOUS

## 2014-07-01 MED ORDER — FENTANYL CITRATE 0.05 MG/ML IJ SOLN
INTRAMUSCULAR | Status: AC
  Administered 2014-07-01: 50 ug via INTRAVENOUS
  Filled 2014-07-01: qty 2

## 2014-07-01 MED ORDER — LACTATED RINGERS IV SOLN
INTRAVENOUS | Status: DC
  Administered 2014-07-01 (×2): via INTRAVENOUS

## 2014-07-01 MED ORDER — OXYCODONE-ACETAMINOPHEN 5-325 MG PO TABS
1.0000 | ORAL_TABLET | ORAL | Status: DC | PRN
  Administered 2014-07-01 (×2): 1 via ORAL
  Administered 2014-07-02: 2 via ORAL
  Filled 2014-07-01 (×2): qty 1

## 2014-07-01 MED ORDER — BUPIVACAINE HCL (PF) 0.5 % IJ SOLN
INTRAMUSCULAR | Status: AC
  Filled 2014-07-01: qty 30

## 2014-07-01 MED ORDER — CEFAZOLIN SODIUM-DEXTROSE 2-3 GM-% IV SOLR
2.0000 g | INTRAVENOUS | Status: AC
  Administered 2014-07-01: 2 g via INTRAVENOUS

## 2014-07-01 MED ORDER — OXYTOCIN 40 UNITS IN LACTATED RINGERS INFUSION - SIMPLE MED: 62.5000 mL/h | INTRAVENOUS | Status: AC

## 2014-07-01 MED ORDER — SCOPOLAMINE 1 MG/3DAYS TD PT72
1.0000 | MEDICATED_PATCH | Freq: Once | TRANSDERMAL | Status: DC
  Filled 2014-07-01: qty 1

## 2014-07-01 MED ORDER — ONDANSETRON HCL 4 MG/2ML IJ SOLN: 4.0000 mg | Freq: Three times a day (TID) | INTRAMUSCULAR | Status: DC | PRN

## 2014-07-01 MED ORDER — FENTANYL CITRATE 0.05 MG/ML IJ SOLN
25.0000 ug | INTRAMUSCULAR | Status: DC | PRN
  Administered 2014-07-01: 50 ug via INTRAVENOUS

## 2014-07-01 MED ORDER — ONDANSETRON HCL 4 MG/2ML IJ SOLN
INTRAMUSCULAR | Status: DC | PRN
  Administered 2014-07-01: 4 mg via INTRAVENOUS

## 2014-07-01 MED ORDER — ONDANSETRON HCL 4 MG PO TABS: 4.0000 mg | ORAL_TABLET | ORAL | Status: DC | PRN

## 2014-07-01 MED ORDER — MENTHOL 3 MG MT LOZG: 1.0000 | LOZENGE | OROMUCOSAL | Status: DC | PRN

## 2014-07-01 MED ORDER — LACTATED RINGERS IV SOLN
INTRAVENOUS | Status: DC | PRN
  Administered 2014-07-01: 08:00:00 via INTRAVENOUS

## 2014-07-01 MED ORDER — SIMETHICONE 80 MG PO CHEW
80.0000 mg | CHEWABLE_TABLET | Freq: Three times a day (TID) | ORAL | Status: DC
  Administered 2014-07-01 – 2014-07-03 (×6): 80 mg via ORAL
  Filled 2014-07-01 (×6): qty 1

## 2014-07-01 MED ORDER — SCOPOLAMINE 1 MG/3DAYS TD PT72
1.0000 | MEDICATED_PATCH | Freq: Once | TRANSDERMAL | Status: DC
  Administered 2014-07-01: 1.5 mg via TRANSDERMAL

## 2014-07-01 MED ORDER — OXYTOCIN 10 UNIT/ML IJ SOLN
INTRAMUSCULAR | Status: AC
  Filled 2014-07-01: qty 4

## 2014-07-01 MED ORDER — NALBUPHINE HCL 10 MG/ML IJ SOLN: 5.0000 mg | INTRAMUSCULAR | Status: DC | PRN

## 2014-07-01 MED ORDER — PNEUMOCOCCAL VAC POLYVALENT 25 MCG/0.5ML IJ INJ
0.5000 mL | INJECTION | INTRAMUSCULAR | Status: AC
  Administered 2014-07-03: 0.5 mL via INTRAMUSCULAR
  Filled 2014-07-01 (×2): qty 0.5

## 2014-07-01 MED ORDER — SENNOSIDES-DOCUSATE SODIUM 8.6-50 MG PO TABS
2.0000 | ORAL_TABLET | ORAL | Status: DC
  Administered 2014-07-01 – 2014-07-02 (×2): 2 via ORAL
  Filled 2014-07-01 (×2): qty 2

## 2014-07-01 MED ORDER — PHENYLEPHRINE 8 MG IN D5W 100 ML (0.08MG/ML) PREMIX OPTIME
INJECTION | INTRAVENOUS | Status: DC | PRN
  Administered 2014-07-01: 60 ug/min via INTRAVENOUS

## 2014-07-01 MED ORDER — SIMETHICONE 80 MG PO CHEW
80.0000 mg | CHEWABLE_TABLET | ORAL | Status: DC
  Administered 2014-07-01 – 2014-07-02 (×2): 80 mg via ORAL
  Filled 2014-07-01 (×2): qty 1

## 2014-07-01 MED ORDER — DIPHENHYDRAMINE HCL 25 MG PO CAPS: 25.0000 mg | ORAL_CAPSULE | Freq: Four times a day (QID) | ORAL | Status: DC | PRN

## 2014-07-01 MED ORDER — SIMETHICONE 80 MG PO CHEW: 80.0000 mg | CHEWABLE_TABLET | ORAL | Status: DC | PRN

## 2014-07-01 MED ORDER — BUPIVACAINE IN DEXTROSE 0.75-8.25 % IT SOLN
INTRATHECAL | Status: DC | PRN
  Administered 2014-07-01: 1.6 mL via INTRATHECAL

## 2014-07-01 MED ORDER — PROMETHAZINE HCL 25 MG/ML IJ SOLN: 6.2500 mg | INTRAMUSCULAR | Status: DC | PRN

## 2014-07-01 MED ORDER — FENTANYL CITRATE 0.05 MG/ML IJ SOLN
INTRAMUSCULAR | Status: DC | PRN
  Administered 2014-07-01: 25 ug via INTRATHECAL

## 2014-07-01 MED ORDER — TETANUS-DIPHTH-ACELL PERTUSSIS 5-2.5-18.5 LF-MCG/0.5 IM SUSP
0.5000 mL | Freq: Once | INTRAMUSCULAR | Status: AC
  Administered 2014-07-01: 0.5 mL via INTRAMUSCULAR
  Filled 2014-07-01: qty 0.5

## 2014-07-01 MED ORDER — ONDANSETRON HCL 4 MG/2ML IJ SOLN
INTRAMUSCULAR | Status: AC
  Filled 2014-07-01: qty 2

## 2014-07-01 MED ORDER — ACETAMINOPHEN 160 MG/5ML PO SOLN: 325.0000 mg | ORAL | Status: DC | PRN

## 2014-07-01 MED ORDER — PANTOPRAZOLE SODIUM 40 MG PO TBEC: 40.0000 mg | DELAYED_RELEASE_TABLET | Freq: Every day | ORAL | Status: DC

## 2014-07-01 MED ORDER — ACETAMINOPHEN 325 MG PO TABS: 325.0000 mg | ORAL_TABLET | ORAL | Status: DC | PRN

## 2014-07-01 MED ORDER — MORPHINE SULFATE 0.5 MG/ML IJ SOLN
INTRAMUSCULAR | Status: AC
  Filled 2014-07-01: qty 10

## 2014-07-01 MED ORDER — DIPHENHYDRAMINE HCL 50 MG/ML IJ SOLN: 12.5000 mg | INTRAMUSCULAR | Status: DC | PRN

## 2014-07-01 MED ORDER — OXYCODONE-ACETAMINOPHEN 5-325 MG PO TABS
2.0000 | ORAL_TABLET | ORAL | Status: DC | PRN
  Administered 2014-07-02 – 2014-07-03 (×6): 2 via ORAL
  Filled 2014-07-01 (×7): qty 2

## 2014-07-01 MED ORDER — BUPIVACAINE HCL (PF) 0.5 % IJ SOLN
INTRAMUSCULAR | Status: DC | PRN
  Administered 2014-07-01: 30 mL

## 2014-07-01 MED ORDER — ZOLPIDEM TARTRATE 5 MG PO TABS: 5.0000 mg | ORAL_TABLET | Freq: Every evening | ORAL | Status: DC | PRN

## 2014-07-01 MED ORDER — MIDAZOLAM HCL 2 MG/2ML IJ SOLN: 0.5000 mg | Freq: Once | INTRAMUSCULAR | Status: DC | PRN

## 2014-07-01 MED ORDER — WITCH HAZEL-GLYCERIN EX PADS: 1.0000 "application " | MEDICATED_PAD | CUTANEOUS | Status: DC | PRN

## 2014-07-01 MED ORDER — CEFAZOLIN SODIUM-DEXTROSE 2-3 GM-% IV SOLR
INTRAVENOUS | Status: AC
  Filled 2014-07-01: qty 50

## 2014-07-01 MED ORDER — ALBUTEROL SULFATE (2.5 MG/3ML) 0.083% IN NEBU
3.0000 mL | INHALATION_SOLUTION | Freq: Four times a day (QID) | RESPIRATORY_TRACT | Status: DC | PRN
  Administered 2014-07-02: 3 mL via RESPIRATORY_TRACT
  Filled 2014-07-01: qty 3

## 2014-07-01 MED ORDER — DIPHENHYDRAMINE HCL 25 MG PO CAPS: 25.0000 mg | ORAL_CAPSULE | ORAL | Status: DC | PRN

## 2014-07-01 MED ORDER — PRENATAL MULTIVITAMIN CH
1.0000 | ORAL_TABLET | Freq: Every day | ORAL | Status: DC
  Administered 2014-07-03: 1 via ORAL
  Filled 2014-07-01: qty 1

## 2014-07-01 SURGICAL SUPPLY — 36 items
BENZOIN TINCTURE PRP APPL 2/3 (GAUZE/BANDAGES/DRESSINGS) ×3 IMPLANT
CLAMP CORD UMBIL (MISCELLANEOUS) IMPLANT
CLOSURE WOUND 1/2 X4 (GAUZE/BANDAGES/DRESSINGS) ×1
CLOTH BEACON ORANGE TIMEOUT ST (SAFETY) ×3 IMPLANT
DRAPE SHEET LG 3/4 BI-LAMINATE (DRAPES) IMPLANT
DRSG OPSITE POSTOP 4X10 (GAUZE/BANDAGES/DRESSINGS) ×3 IMPLANT
DRSG OPSITE POSTOP 4X12 (GAUZE/BANDAGES/DRESSINGS) ×3 IMPLANT
DURAPREP 26ML APPLICATOR (WOUND CARE) ×3 IMPLANT
ELECT REM PT RETURN 9FT ADLT (ELECTROSURGICAL) ×3
ELECTRODE REM PT RTRN 9FT ADLT (ELECTROSURGICAL) ×1 IMPLANT
EXTRACTOR VACUUM KIWI (MISCELLANEOUS) IMPLANT
GLOVE BIO SURGEON ST LM GN SZ9 (GLOVE) ×3 IMPLANT
GLOVE BIOGEL PI IND STRL 9 (GLOVE) ×1 IMPLANT
GLOVE BIOGEL PI INDICATOR 9 (GLOVE) ×2
GOWN STRL REUS W/TWL 2XL LVL3 (GOWN DISPOSABLE) ×3 IMPLANT
GOWN STRL REUS W/TWL LRG LVL3 (GOWN DISPOSABLE) ×3 IMPLANT
NEEDLE HYPO 25X1 1.5 SAFETY (NEEDLE) ×3 IMPLANT
NEEDLE HYPO 25X5/8 SAFETYGLIDE (NEEDLE) IMPLANT
NS IRRIG 1000ML POUR BTL (IV SOLUTION) ×3 IMPLANT
PACK C SECTION WH (CUSTOM PROCEDURE TRAY) ×3 IMPLANT
PAD OB MATERNITY 4.3X12.25 (PERSONAL CARE ITEMS) ×3 IMPLANT
RTRCTR C-SECT PINK 25CM LRG (MISCELLANEOUS) IMPLANT
RTRCTR C-SECT PINK 34CM XLRG (MISCELLANEOUS) IMPLANT
STRIP CLOSURE SKIN 1/2X4 (GAUZE/BANDAGES/DRESSINGS) ×2 IMPLANT
SUT MNCRL 0 VIOLET CTX 36 (SUTURE) ×2 IMPLANT
SUT MONOCRYL 0 CTX 36 (SUTURE) ×4
SUT PLAIN 2 0 XLH (SUTURE) ×6 IMPLANT
SUT VIC AB 0 CT1 27 (SUTURE) ×2
SUT VIC AB 0 CT1 27XBRD ANBCTR (SUTURE) ×1 IMPLANT
SUT VIC AB 2-0 CT1 27 (SUTURE) ×2
SUT VIC AB 2-0 CT1 TAPERPNT 27 (SUTURE) ×1 IMPLANT
SUT VIC AB 4-0 KS 27 (SUTURE) ×3 IMPLANT
SYR BULB IRRIGATION 50ML (SYRINGE) IMPLANT
SYR CONTROL 10ML LL (SYRINGE) ×3 IMPLANT
TOWEL OR 17X24 6PK STRL BLUE (TOWEL DISPOSABLE) ×3 IMPLANT
TRAY FOLEY CATH 14FR (SET/KITS/TRAYS/PACK) ×3 IMPLANT

## 2014-07-01 NOTE — Addendum Note (Signed)
Addendum  created 07/01/14 1310 by Renford DillsJanet L Divya Munshi, CRNA   Modules edited: Notes Section   Notes Section:  File: [id number]

## 2014-07-01 NOTE — H&P (Signed)
Christy Ross is a 29 y.o. female presenting for repeat cesarean section at 39 wks.. She has a prior cesarean for CPD, after prolonged labor, and declines TOLAC. We will widely excise the old cicatrix. Pt understands the increased incision size may require placement of a Sub-cutaneous drain, to reduce bleeding risk. Risks, benefits, alternatives are discussed and consented . History OB History    Gravida Para Term Preterm AB TAB SAB Ectopic Multiple Living   3 1 1  0 1 0 1 0 0 1     Past Medical History  Diagnosis Date  . Asthma   . Missed ab 2012  . Heartburn in pregnancy   . History of kidney stones     passed stones - no surgery  . Headache     otc med prn   Past Surgical History  Procedure Laterality Date  . Cesarean section  03/26/2008  . Wisdom tooth extraction    . Leep     Family History: family history includes Cancer in her paternal grandfather and paternal grandmother; Diabetes in her father; Hyperlipidemia in her father; Hypertension in her father; Multiple sclerosis in her mother. Social History:  reports that she has been smoking Cigarettes.  She has a 5 pack-year smoking history. She has never used smokeless tobacco. She reports that she uses illicit drugs (Marijuana). She reports that she does not drink alcohol.   Prenatal Transfer Tool  Maternal Diabetes: No Genetic Screening: Normal Maternal Ultrasounds/Referrals: Normal Fetal Ultrasounds or other Referrals:  None Maternal Substance Abuse:  Yes:  Type: Marijuana, Other: early in pregnancy , negative testing late in pregnancy confirms no use after onset of prenatal care. Significant Maternal Medications:  None Protonix Significant Maternal Lab Results:  Lab values include: Group B Strep negative Other Comments:  None  ROS    Blood pressure 120/77, pulse 104, temperature 98.1 F (36.7 C), temperature source Oral, resp. rate 18, SpO2 98 %. Exam Physical Exam  Constitutional: She is oriented to person,  place, and time. She appears well-developed and well-nourished.  HENT:  Head: Normocephalic and atraumatic.  Eyes: Pupils are equal, round, and reactive to light.  Neck: Normal range of motion. Neck supple.  Cardiovascular: Normal rate.   Respiratory: Effort normal and breath sounds normal.  GI: Soft.  Gravid uterus c/w dates, FHR 143  Genitourinary: Vagina normal.  Musculoskeletal: Normal range of motion.  Neurological: She is alert and oriented to person, place, and time.  Skin: Skin is warm and dry.  Psychiatric: She has a normal mood and affect. Her behavior is normal. Judgment and thought content normal.    Prenatal labs: ABO, Rh: --/--/O POS (01/22 1120) Antibody: NEG (01/22 1118) Rubella: 1.36 (06/16 1436) RPR: Non Reactive (01/22 1118)  HBsAg: NEGATIVE (06/16 1436)  HIV: NONREACTIVE (11/04 1024)  GBS: NOT DETECTED (01/06 1110)    Assessment/Plan: Pregnancy 39 wk, Prior cesarean section declining TOLAC. Plan  Repeat cesarean, wide excision of cicatrix today   Heimsoth,Khoa Opdahl V 07/01/2014, 6:50 AM

## 2014-07-01 NOTE — Anesthesia Postprocedure Evaluation (Signed)
  Anesthesia Post-op Note  Patient: Christy Ross  Procedure(s) Performed: Procedure(s): CESAREAN SECTION REPEAT (N/A)  Patient Location: 146  Anesthesia Type:Spinal  Level of Consciousness: awake  Airway and Oxygen Therapy: Patient Spontanous Breathing  Post-op Pain: mild  Post-op Assessment: Patient's Cardiovascular Status Stable and Respiratory Function Stable  Post-op Vital Signs: stable  Last Vitals:  Filed Vitals:   07/01/14 1200  BP: 111/55  Pulse: 58  Temp: 36.8 C  Resp: 16    Complications: No apparent anesthesia complications

## 2014-07-01 NOTE — Anesthesia Procedure Notes (Signed)
Spinal Patient location during procedure: OR Start time: 07/01/2014 7:18 AM Staffing Anesthesiologist: Tiffanny Lamarche A. Performed by: anesthesiologist  Preanesthetic Checklist Completed: patient identified, site marked, surgical consent, pre-op evaluation, timeout performed, IV checked, risks and benefits discussed and monitors and equipment checked Spinal Block Patient position: sitting Prep: site prepped and draped and DuraPrep Patient monitoring: heart rate, cardiac monitor, continuous pulse ox and blood pressure Approach: midline Location: L3-4 Injection technique: single-shot Needle Needle type: Sprotte  Needle gauge: 24 G Needle length: 9 cm Needle insertion depth: 6 cm Assessment Sensory level: T3 Additional Notes Patient tolerated procedure well. Adequate sensory level.

## 2014-07-01 NOTE — Brief Op Note (Addendum)
07/01/2014  8:37 AM  PATIENT:  Christy Ross  29 y.o. female  PRE-OPERATIVE DIAGNOSIS:  repeat c-section pregnancy 39 weeks prior cesarean section declining trial of labor  POST-OPERATIVE DIAGNOSIS:  repeat c-section, same, delivered  PROCEDURE:  Procedure(s): CESAREAN SECTION REPEAT (N/A)  SURGEON:  Surgeon(s) and Role:    * Tilda BurrowJohn Surges V, MD - Primary  PHYSICIAN ASSISTANT:   ASSISTANTS: none   ANESTHESIA:   spinal  EBL:  Total I/O In: 2400 [I.V.:2400] Out: 950 [Urine:150; Blood:800]  BLOOD ADMINISTERED:none  DRAINS: Urinary Catheter (Foley)   LOCAL MEDICATIONS USED:  MARCAINE    and Amount: 20 ml  SPECIMEN:  Source of Specimen:  Placenta to labor and delivery  DISPOSITION OF SPECIMEN:  Labor and delivery  COUNTS:  YES  TOURNIQUET:  * No tourniquets in log *  DICTATION: .Dragon Dictation  PLAN OF CARE: Admit to inpatient   PATIENT DISPOSITION:  PACU - hemodynamically stable.   Delay start of Pharmacological VTE agent (>24hrs) due to surgical blood loss or risk of bleeding: not applicable

## 2014-07-01 NOTE — Op Note (Signed)
07/01/2014  8:37 AM  PATIENT:  Charlton HawsErica N Wildasin  29 y.o. female  PRE-OPERATIVE DIAGNOSIS:  repeat c-section pregnancy 39 weeks prior cesarean section declining trial of labor  POST-OPERATIVE DIAGNOSIS:  repeat c-section, same, delivered  PROCEDURE:  Procedure(s): CESAREAN SECTION REPEAT (N/A) excision of old cicatrix  SURGEON:  Surgeon(s) and Role:    * Tilda BurrowJohn Buell V, MD - Primary  PHYSICIAN ASSISTANT:   ASSISTANTS: none   ANESTHESIA:   spinal  EBL:  Total I/O In: 2400 [I.V.:2400] Out: 950 [Urine:150; Blood:800]  BLOOD ADMINISTERED:none  DRAINS: Urinary Catheter (Foley)   LOCAL MEDICATIONS USED:  MARCAINE    and Amount: 20 ml  SPECIMEN:  Source of Specimen:  Placenta to labor and delivery  DISPOSITION OF SPECIMEN:  Labor and delivery  COUNTS:  YES  TOURNIQUET:  * No tourniquets in log *  DICTATION: .Dragon Dictation  PLAN OF CARE: Admit to inpatient   PATIENT DISPOSITION:  PACU - hemodynamically stable.   Delay start of Pharmacological VTE agent (>24hrs) due to surgical blood loss or risk of bleeding: not applicable Details of procedure:. Patient was taken to the operating room prepped and draped for lower abdominal surgery. There was some anxiety associated with the spinal but she cooperated adequately for effective spinal placement. Abdomen was prepped and draped, Foley catheter in place, the lower abdominal scar had been previously marked for excision. Excision of the redundant lower abdominal cicatrix and adjacent scan was performed as opening of the fascia, midline entry the peritoneal cavity. There were no adhesions from the prior surgery. Bladder flap was developed, Alexis wound retractor in place. Transverse uterine incision performed without difficulty and fetal vertex delivered into the incision with fundal pressure and the baby easily extracted. Apgars were 9 and 9. The female infant cried vigorously. Amniotic fluid was clear. See pediatrician's notes for  further details. Placenta delivered after blood samples were obtained, intact. There was some bleeding from venous oozers on the patient's right side. Hemostasis was easily achieved with single layer running locking first layer 0 Monocryl which was oversewn with running 0 Monocryl.. Abdomen was irrigated, anterior peritoneum closed with 2-0 Vicryl, fascia closed with running 0 Vicryl, the subcutaneous fatty tissues irrigated, inspected for hemostasis, reapproximated with interrupted in 2 layers, then subcuticular 4-0 Vicryl closure the skin incision with good tissue edge approximation. Patient tolerated procedure well went to recovery in stable condition with 800 cc EBL.

## 2014-07-01 NOTE — Progress Notes (Signed)
Called Dr. Su Hiltoberts due to [facility]  Instructed to give 1L LR bolus then reduce back to LR @ 125cc/hr.

## 2014-07-01 NOTE — Lactation Note (Signed)
This note was copied from the chart of Christy Bella Kennedyrica Gunderson. Lactation Consultation Note  Patient Name: Christy Ross ZOXWR'UToday's Date: 07/01/2014 Reason for consult: Initial assessment;Other (Comment) (pump only; does not want to latch baby to breast) Mom wants to bottle-feed ebm and has already started using a symphony DEBP to obtain any ebm that she can feed baby, although she is formula feeding until ebm obtained.  LC encouraged frequent STS and hand expression, as well as DEBP for 15 minutes q3h for maximum milk production.  LC also reviewed milk storage guidelines (Baby and Me, page 6525) Mom says she knows how to hand express her milk. Mom encouraged to feed baby 8-12 times/24 hours and with feeding cues. LC encouraged review of Baby and Me pp 9, 14 and 20-25 for STS and BF information. LC provided [employer] Resource brochure and reviewed [facility] services and list of community and web site resources.    Maternal Data Formula Feeding for Exclusion: Yes Reason for exclusion: Mother's choice to formula and breast feed on admission (will pump only) Has patient been taught Hand Expression?: Yes (mom says she knows how to hand express her milk)  Feeding Feeding Type: Bottle Fed - Formula Nipple Type: Slow - flow  LATCH Score/Interventions              N/A - mom pumping and bottle-feeding only, per choice        Lactation Tools Discussed/Used Pump Review: Milk Storage;Other (comment) (encouraged to use DEBP q3h for 15 min on premie setting until >15 ml's per breast, then use regular "standard" setting) Initiated by:: RN, Baxter HireKristen assisted with pump initiation Date initiated:: 07/01/14   Consult Status Consult Status: Follow-up Date: 07/02/14 Follow-up type: In-patient    Warrick ParisianBryant, Jim Philemon [facility] 07/01/2014, 7:49 PM

## 2014-07-01 NOTE — Anesthesia Postprocedure Evaluation (Signed)
  Anesthesia Post-op Note  Patient: Christy Ross  Procedure(s) Performed: Procedure(s): CESAREAN SECTION REPEAT (N/A)  Patient Location: PACU  Anesthesia Type:Spinal  Level of Consciousness: awake, alert  and oriented  Airway and Oxygen Therapy: Patient Spontanous Breathing  Post-op Pain: mild  Post-op Assessment: Post-op Vital signs reviewed, Patient's Cardiovascular Status Stable, Respiratory Function Stable, Patent Airway, No signs of Nausea or vomiting, Pain level controlled, No headache, No backache, No residual numbness and No residual motor weakness  Post-op Vital Signs: Reviewed and stable  Last Vitals:  Filed Vitals:   07/01/14 0930  BP: 118/52  Pulse: 58  Temp: 36.7 C  Resp: 19    Complications: No apparent anesthesia complications

## 2014-07-01 NOTE — Transfer of Care (Signed)
Immediate Anesthesia Transfer of Care Note  Patient: Christy Ross  Procedure(s) Performed: Procedure(s): CESAREAN SECTION REPEAT (N/A)  Patient Location: PACU  Anesthesia Type:Spinal  Level of Consciousness: awake  Airway & Oxygen Therapy: Patient Spontanous Breathing  Post-op Assessment: Report given to PACU RN  Post vital signs: Reviewed and stable  Complications: No apparent anesthesia complications

## 2014-07-01 NOTE — Anesthesia Preprocedure Evaluation (Signed)
Anesthesia Evaluation  Patient identified by MRN, date of birth, ID band Patient awake    Reviewed: Allergy & Precautions, H&P , NPO status , Patient's Chart, lab work & pertinent test results  Airway Mallampati: II       Dental   Pulmonary asthma , Current Smoker,  breath sounds clear to auscultation        Cardiovascular Exercise Tolerance: Good Rhythm:regular Rate:Normal     Neuro/Psych  Headaches,    GI/Hepatic GERD-  ,  Endo/Other  Morbid obesity  Renal/GU      Musculoskeletal   Abdominal   Peds  Hematology   Anesthesia Other Findings   Reproductive/Obstetrics (+) Pregnancy                             Anesthesia Physical Anesthesia Plan  ASA: III  Anesthesia Plan: Spinal   Post-op Pain Management:    Induction:   Airway Management Planned:   Additional Equipment:   Intra-op Plan:   Post-operative Plan:   Informed Consent: I have reviewed the patients History and Physical, chart, labs and discussed the procedure including the risks, benefits and alternatives for the proposed anesthesia with the patient or authorized representative who has indicated his/her understanding and acceptance.     Plan Discussed with: Anesthesiologist, CRNA and Surgeon  Anesthesia Plan Comments:         Anesthesia Quick Evaluation

## 2014-07-02 ENCOUNTER — Encounter (HOSPITAL_COMMUNITY): Payer: Self-pay | Admitting: Obstetrics and Gynecology

## 2014-07-02 LAB — TYPE AND SCREEN
ABO/RH(D): O POS
Antibody Screen: NEGATIVE
Unit division: 0
Unit division: 0

## 2014-07-02 LAB — CBC
HEMATOCRIT: 29 % — AB (ref 36.0–46.0)
HEMOGLOBIN: 9.9 g/dL — AB (ref 12.0–15.0)
MCH: 32.1 pg (ref 26.0–34.0)
MCHC: 34.1 g/dL (ref 30.0–36.0)
MCV: 94.2 fL (ref 78.0–100.0)
Platelets: 169 10*3/uL (ref 150–400)
RBC: 3.08 MIL/uL — ABNORMAL LOW (ref 3.87–5.11)
RDW: 13.2 % (ref 11.5–15.5)
WBC: 8.5 10*3/uL (ref 4.0–10.5)

## 2014-07-02 LAB — BIRTH TISSUE RECOVERY COLLECTION (PLACENTA DONATION)

## 2014-07-02 NOTE — Lactation Note (Addendum)
This note was copied from the chart of Girl Bella Kennedyrica Larner. Lactation Consultation Note  Patient Name: Girl Bella Kennedyrica Buboltz WUJWJ'XToday's Date: 07/02/2014 Reason for consult: Follow-up assessment  Patient is experienced with bottle-feeding EBM and is a PRN patient only.  Baby 27 hours of life. Mom reports that pumping is going fine and that it took a while for her milk to come in with her first child. Enc mom to call for assistance with pumping/lactating as needed, and is aware of LC phone line assistance after D/C. Maternal Data    Feeding    LATCH Score/Interventions                      Lactation Tools Discussed/Used     Consult Status Consult Status: PRN    Geralynn OchsWILLIARD, Khylee Algeo 07/02/2014, 11:18 AM

## 2014-07-02 NOTE — Progress Notes (Signed)
Subjective: Postpartum Day 1: Cesarean Delivery Patient reports tolerating PO, + flatus and no problems voiding.  States she is tolerating this procedure much better than her prior LTSC  Objective: Vital signs in last 24 hours: Temp:  [97.4 F (36.3 C)-98.3 F (36.8 C)] 97.6 F (36.4 C) (01/26 0640) Pulse Rate:  [51-76] 66 (01/26 0640) Resp:  [14-19] 18 (01/26 0640) BP: (92-118)/(37-65) 109/65 mmHg (01/26 0640) SpO2:  [91 %-100 %] 100 % (01/26 0640) Weight:  [106.595 kg (235 lb)] 106.595 kg (235 lb) (01/25 2100)  Physical Exam:  General: alert, cooperative, appears stated age and no distress Lochia: appropriate Uterine Fundus: firm Incision: healing well, no significant drainage, no dehiscence, no significant erythema DVT Evaluation: No evidence of DVT seen on physical exam. No significant calf/ankle edema.   Recent Labs  07/02/14 0540  HGB 9.9*  HCT 29.0*    Assessment/Plan: Status post Cesarean section. Doing well postoperatively.  Continue current care. Plan to DC Tomorrow if patient continues to progress well.  Kathee DeltonMcKeag, Harley Fitzwater D 07/02/2014, 7:18 AM

## 2014-07-03 MED ORDER — OXYCODONE-ACETAMINOPHEN 5-325 MG PO TABS: 1.0000 | ORAL_TABLET | ORAL | Status: AC | PRN

## 2014-07-03 MED ORDER — SENNOSIDES-DOCUSATE SODIUM 8.6-50 MG PO TABS: 2.0000 | ORAL_TABLET | ORAL | Status: AC

## 2014-07-03 MED ORDER — IBUPROFEN 600 MG PO TABS: 600.0000 mg | ORAL_TABLET | Freq: Four times a day (QID) | ORAL | Status: AC

## 2014-07-03 NOTE — Discharge Summary (Signed)
Obstetric Discharge Summary Reason for Admission: cesarean section Prenatal Procedures: none Intrapartum Procedures: cesarean: low cervical, transverse Postpartum Procedures: none Complications-Operative and Postpartum: none HEMOGLOBIN  Date Value Ref Range Status  07/02/2014 9.9* 12.0 - 15.0 g/dL Final   HCT  Date Value Ref Range Status  07/02/2014 29.0* 36.0 - 46.0 % Final   Delivery:  Christy Ross  is a 29 y.o.  [id number]  who delivered a baby girl at [id number]  On 06/21/2014 via rLTCS. APGAR: 9, 9. Birth weight: 7lbs 4.4 oz. Tolerated procedure well, no complications. EBL 800 mL.   Today:  VSS, has done well overnight. Ambulating well, tolerating PO liquids and solids, voiding and passing flatus. No BM yet, but patient states she is unable to have BM outside of her own home. Patient pumping to provide baby with breastmilk, but will not breastfeed. Declines any form of contraception at this time.   Physical Exam:  General: alert, cooperative and no distress Lochia: appropriate Uterine Fundus: firm Incision: healing well, no significant drainage, no dehiscence, no significant erythema. Honeycomb dressing in place.  DVT Evaluation: No evidence of DVT seen on physical exam. No cords or calf tenderness. Calf/Ankle edema is present: 1+, bilateral, up to ankles.   Discharge Diagnoses: Term Pregnancy-delivered  Discharge Information: Date: 07/03/2014 Activity: unrestricted Diet: routine Medications: Percocet Condition: stable Instructions: refer to practice specific booklet Discharge to: home   Newborn Data: Live born female  Birth Weight: 7 lb 4.4 oz (3300 g) APGAR: 9, 9  Home with mother.  Misior, Christy Ross 07/03/2014, 7:29 AM

## 2014-07-03 NOTE — Discharge Instructions (Signed)

## 2014-07-03 NOTE — Lactation Note (Signed)
This note was copied from the chart of Christy Ross. Lactation Consultation Note  Patient Name: Christy Bella Kennedyrica Ludemann ZOXWR'UToday's Date: 07/03/2014 Reason for consult: Follow-up assessment Mom is pump/bottle feeding. Denies questions or concerns. Has DEBP for home use. Advised to refer to Baby N Me booklet pages 24-25 for engorgement care and storage guidelines. Advised of OP services and support group.   Maternal Data    Feeding Feeding Type: Bottle Fed - Formula Nipple Type: Slow - flow  LATCH Score/Interventions                      Lactation Tools Discussed/Used Tools: Pump Breast pump type: Double-Electric Breast Pump   Consult Status Consult Status: Complete Date: 07/03/14 Follow-up type: In-patient    Alfred LevinsGranger, Sevana Grandinetti Ann 07/03/2014, 11:53 AM

## 2014-07-08 ENCOUNTER — Encounter: Payer: Medicaid Other | Admitting: Obstetrics and Gynecology

## 2014-08-05 ENCOUNTER — Ambulatory Visit: Payer: Medicaid Other | Admitting: Obstetrics and Gynecology

## 2014-08-27 ENCOUNTER — Encounter: Payer: Self-pay | Admitting: *Deleted
